# Patient Record
Sex: Female | Born: 1965 | Race: Black or African American | Hispanic: No | Smoking: Former smoker
Health system: Southern US, Community
[De-identification: ages and names within clinical notes are randomized; demographics above are authoritative.]

## PROBLEM LIST (undated history)

## (undated) DIAGNOSIS — I10 Essential (primary) hypertension: Secondary | ICD-10-CM

## (undated) DIAGNOSIS — I8390 Asymptomatic varicose veins of unspecified lower extremity: Secondary | ICD-10-CM

## (undated) HISTORY — PX: TUBAL LIGATION: SHX77

## (undated) HISTORY — PX: COSMETIC SURGERY: SHX468

## (undated) HISTORY — DX: Asymptomatic varicose veins of unspecified lower extremity: I83.90

---

## 1997-11-26 ENCOUNTER — Other Ambulatory Visit: Admission: RE | Admit: 1997-11-26 | Discharge: 1997-11-26 | Payer: Self-pay | Admitting: *Deleted

## 1999-03-17 ENCOUNTER — Encounter: Admission: RE | Admit: 1999-03-17 | Discharge: 1999-03-17 | Payer: Self-pay | Admitting: Family Medicine

## 1999-05-04 ENCOUNTER — Encounter: Admission: RE | Admit: 1999-05-04 | Discharge: 1999-05-04 | Payer: Self-pay | Admitting: Family Medicine

## 1999-08-28 ENCOUNTER — Emergency Department (HOSPITAL_COMMUNITY): Admission: EM | Admit: 1999-08-28 | Discharge: 1999-08-28 | Payer: Self-pay | Admitting: Emergency Medicine

## 1999-11-14 ENCOUNTER — Ambulatory Visit (HOSPITAL_COMMUNITY): Admission: RE | Admit: 1999-11-14 | Discharge: 1999-11-14 | Payer: Self-pay | Admitting: *Deleted

## 1999-11-14 ENCOUNTER — Encounter: Payer: Self-pay | Admitting: *Deleted

## 2000-03-22 ENCOUNTER — Encounter (INDEPENDENT_AMBULATORY_CARE_PROVIDER_SITE_OTHER): Payer: Self-pay | Admitting: Specialist

## 2000-03-22 ENCOUNTER — Inpatient Hospital Stay (HOSPITAL_COMMUNITY): Admission: AD | Admit: 2000-03-22 | Discharge: 2000-03-24 | Payer: Self-pay | Admitting: *Deleted

## 2000-12-17 ENCOUNTER — Encounter: Admission: RE | Admit: 2000-12-17 | Discharge: 2000-12-17 | Payer: Self-pay | Admitting: Family Medicine

## 2000-12-17 ENCOUNTER — Other Ambulatory Visit: Admission: RE | Admit: 2000-12-17 | Discharge: 2000-12-17 | Payer: Self-pay | Admitting: Family Medicine

## 2001-01-16 ENCOUNTER — Emergency Department (HOSPITAL_COMMUNITY): Admission: EM | Admit: 2001-01-16 | Discharge: 2001-01-17 | Payer: Self-pay | Admitting: Emergency Medicine

## 2001-01-18 ENCOUNTER — Encounter: Admission: RE | Admit: 2001-01-18 | Discharge: 2001-01-18 | Payer: Self-pay | Admitting: Family Medicine

## 2001-01-25 ENCOUNTER — Encounter: Admission: RE | Admit: 2001-01-25 | Discharge: 2001-01-25 | Payer: Self-pay | Admitting: Family Medicine

## 2001-02-04 ENCOUNTER — Encounter: Admission: RE | Admit: 2001-02-04 | Discharge: 2001-02-04 | Payer: Self-pay | Admitting: Family Medicine

## 2001-02-25 ENCOUNTER — Ambulatory Visit (HOSPITAL_COMMUNITY): Admission: RE | Admit: 2001-02-25 | Discharge: 2001-02-25 | Payer: Self-pay | Admitting: Family Medicine

## 2001-02-26 ENCOUNTER — Encounter: Admission: RE | Admit: 2001-02-26 | Discharge: 2001-02-26 | Payer: Self-pay | Admitting: Family Medicine

## 2001-03-15 ENCOUNTER — Ambulatory Visit (HOSPITAL_COMMUNITY): Admission: RE | Admit: 2001-03-15 | Discharge: 2001-03-15 | Payer: Self-pay | Admitting: *Deleted

## 2001-07-12 ENCOUNTER — Encounter: Admission: RE | Admit: 2001-07-12 | Discharge: 2001-07-12 | Payer: Self-pay | Admitting: Family Medicine

## 2001-07-15 ENCOUNTER — Encounter: Admission: RE | Admit: 2001-07-15 | Discharge: 2001-07-15 | Payer: Self-pay | Admitting: Family Medicine

## 2002-11-17 ENCOUNTER — Encounter: Payer: Self-pay | Admitting: Emergency Medicine

## 2002-11-17 ENCOUNTER — Emergency Department (HOSPITAL_COMMUNITY): Admission: EM | Admit: 2002-11-17 | Discharge: 2002-11-17 | Payer: Self-pay | Admitting: Emergency Medicine

## 2003-02-18 ENCOUNTER — Other Ambulatory Visit: Admission: RE | Admit: 2003-02-18 | Discharge: 2003-02-18 | Payer: Self-pay | Admitting: *Deleted

## 2003-12-11 ENCOUNTER — Encounter (INDEPENDENT_AMBULATORY_CARE_PROVIDER_SITE_OTHER): Payer: Self-pay | Admitting: *Deleted

## 2003-12-11 ENCOUNTER — Encounter: Admission: RE | Admit: 2003-12-11 | Discharge: 2003-12-11 | Payer: Self-pay | Admitting: Family Medicine

## 2004-01-04 ENCOUNTER — Encounter: Admission: RE | Admit: 2004-01-04 | Discharge: 2004-01-04 | Payer: Self-pay | Admitting: Sports Medicine

## 2004-01-09 ENCOUNTER — Emergency Department (HOSPITAL_COMMUNITY): Admission: EM | Admit: 2004-01-09 | Discharge: 2004-01-09 | Payer: Self-pay | Admitting: Family Medicine

## 2004-02-15 ENCOUNTER — Ambulatory Visit: Payer: Self-pay | Admitting: Sports Medicine

## 2004-05-04 ENCOUNTER — Ambulatory Visit: Payer: Self-pay | Admitting: Sports Medicine

## 2004-05-19 ENCOUNTER — Ambulatory Visit: Payer: Self-pay | Admitting: Family Medicine

## 2004-07-28 ENCOUNTER — Ambulatory Visit: Payer: Self-pay | Admitting: Sports Medicine

## 2004-11-16 ENCOUNTER — Ambulatory Visit: Payer: Self-pay | Admitting: Family Medicine

## 2005-09-11 ENCOUNTER — Encounter (INDEPENDENT_AMBULATORY_CARE_PROVIDER_SITE_OTHER): Payer: Self-pay | Admitting: *Deleted

## 2005-09-11 LAB — CONVERTED CEMR LAB

## 2005-09-29 ENCOUNTER — Ambulatory Visit: Payer: Self-pay | Admitting: Family Medicine

## 2005-09-29 ENCOUNTER — Encounter (INDEPENDENT_AMBULATORY_CARE_PROVIDER_SITE_OTHER): Payer: Self-pay | Admitting: Specialist

## 2006-02-06 ENCOUNTER — Emergency Department (HOSPITAL_COMMUNITY): Admission: EM | Admit: 2006-02-06 | Discharge: 2006-02-06 | Payer: Self-pay | Admitting: Family Medicine

## 2006-03-09 ENCOUNTER — Ambulatory Visit (HOSPITAL_COMMUNITY): Admission: RE | Admit: 2006-03-09 | Discharge: 2006-03-09 | Payer: Self-pay | Admitting: Obstetrics

## 2006-07-05 DIAGNOSIS — F17200 Nicotine dependence, unspecified, uncomplicated: Secondary | ICD-10-CM

## 2006-07-05 DIAGNOSIS — E669 Obesity, unspecified: Secondary | ICD-10-CM

## 2006-07-05 DIAGNOSIS — I10 Essential (primary) hypertension: Secondary | ICD-10-CM

## 2006-07-06 ENCOUNTER — Encounter (INDEPENDENT_AMBULATORY_CARE_PROVIDER_SITE_OTHER): Payer: Self-pay | Admitting: *Deleted

## 2006-08-02 ENCOUNTER — Emergency Department (HOSPITAL_COMMUNITY): Admission: EM | Admit: 2006-08-02 | Discharge: 2006-08-02 | Payer: Self-pay | Admitting: Family Medicine

## 2006-08-29 ENCOUNTER — Telehealth: Payer: Self-pay | Admitting: *Deleted

## 2007-01-16 ENCOUNTER — Ambulatory Visit: Payer: Self-pay | Admitting: Family Medicine

## 2007-01-16 ENCOUNTER — Encounter (INDEPENDENT_AMBULATORY_CARE_PROVIDER_SITE_OTHER): Payer: Self-pay | Admitting: Family Medicine

## 2007-01-16 LAB — CONVERTED CEMR LAB
Chlamydia, DNA Probe: NEGATIVE
GC Probe Amp, Genital: NEGATIVE

## 2007-01-24 ENCOUNTER — Encounter (INDEPENDENT_AMBULATORY_CARE_PROVIDER_SITE_OTHER): Payer: Self-pay | Admitting: Family Medicine

## 2007-01-24 ENCOUNTER — Ambulatory Visit: Payer: Self-pay | Admitting: Family Medicine

## 2007-01-24 LAB — CONVERTED CEMR LAB
BUN: 12 mg/dL (ref 6–23)
CO2: 23 meq/L (ref 19–32)
Calcium: 9 mg/dL (ref 8.4–10.5)
Chloride: 107 meq/L (ref 96–112)
Cholesterol: 145 mg/dL (ref 0–200)
Creatinine, Ser: 0.69 mg/dL (ref 0.40–1.20)
Glucose, Bld: 95 mg/dL (ref 70–99)
HCT: 41.7 % (ref 36.0–46.0)
HDL: 43 mg/dL (ref >39)
Hemoglobin: 13.9 g/dL (ref 12.0–15.0)
LDL Cholesterol: 91 mg/dL (ref 0–99)
MCHC: 33.3 g/dL (ref 30.0–36.0)
MCV: 87.8 fL (ref 78.0–100.0)
Platelets: 217 10*3/uL (ref 150–400)
Potassium: 4 meq/L (ref 3.5–5.3)
RBC: 4.75 M/uL (ref 3.87–5.11)
RDW: 13.7 % (ref 11.5–14.0)
Sodium: 139 meq/L (ref 135–145)
Total CHOL/HDL Ratio: 3.4
Triglycerides: 53 mg/dL (ref <150)
VLDL: 11 mg/dL (ref 0–40)
WBC: 5.2 10*3/uL (ref 4.0–10.5)

## 2007-02-13 ENCOUNTER — Telehealth (INDEPENDENT_AMBULATORY_CARE_PROVIDER_SITE_OTHER): Payer: Self-pay | Admitting: Family Medicine

## 2007-02-19 ENCOUNTER — Encounter: Payer: Self-pay | Admitting: *Deleted

## 2007-03-06 ENCOUNTER — Ambulatory Visit: Payer: Self-pay | Admitting: Family Medicine

## 2007-04-01 ENCOUNTER — Ambulatory Visit: Payer: Self-pay | Admitting: Family Medicine

## 2007-04-05 ENCOUNTER — Encounter: Admission: RE | Admit: 2007-04-05 | Discharge: 2007-04-05 | Payer: Self-pay | Admitting: Sports Medicine

## 2007-05-31 ENCOUNTER — Encounter: Payer: Self-pay | Admitting: *Deleted

## 2007-06-27 ENCOUNTER — Telehealth (INDEPENDENT_AMBULATORY_CARE_PROVIDER_SITE_OTHER): Payer: Self-pay | Admitting: Family Medicine

## 2008-01-15 ENCOUNTER — Encounter (INDEPENDENT_AMBULATORY_CARE_PROVIDER_SITE_OTHER): Payer: Self-pay | Admitting: *Deleted

## 2008-03-03 ENCOUNTER — Telehealth: Payer: Self-pay | Admitting: Family Medicine

## 2008-03-30 ENCOUNTER — Encounter: Payer: Self-pay | Admitting: *Deleted

## 2008-05-13 ENCOUNTER — Encounter: Payer: Self-pay | Admitting: Family Medicine

## 2008-05-13 ENCOUNTER — Ambulatory Visit: Payer: Self-pay | Admitting: Family Medicine

## 2008-05-13 LAB — CONVERTED CEMR LAB
BUN: 12 mg/dL (ref 6–23)
CO2: 26 meq/L (ref 19–32)
Calcium: 9 mg/dL (ref 8.4–10.5)
Chlamydia, DNA Probe: NEGATIVE
Chloride: 108 meq/L (ref 96–112)
Creatinine, Ser: 0.73 mg/dL (ref 0.40–1.20)
GC Probe Amp, Genital: NEGATIVE
Glucose, Bld: 97 mg/dL (ref 70–99)
Potassium: 4.2 meq/L (ref 3.5–5.3)
Sodium: 141 meq/L (ref 135–145)
Whiff Test: NEGATIVE

## 2008-05-14 ENCOUNTER — Encounter: Payer: Self-pay | Admitting: Family Medicine

## 2008-05-15 ENCOUNTER — Encounter: Payer: Self-pay | Admitting: Family Medicine

## 2008-05-28 ENCOUNTER — Encounter (INDEPENDENT_AMBULATORY_CARE_PROVIDER_SITE_OTHER): Payer: Self-pay | Admitting: Family Medicine

## 2008-07-27 ENCOUNTER — Encounter: Payer: Self-pay | Admitting: Family Medicine

## 2008-08-05 ENCOUNTER — Encounter: Payer: Self-pay | Admitting: Family Medicine

## 2008-12-07 ENCOUNTER — Ambulatory Visit: Payer: Self-pay | Admitting: Family Medicine

## 2008-12-07 ENCOUNTER — Encounter: Payer: Self-pay | Admitting: Family Medicine

## 2008-12-07 LAB — CONVERTED CEMR LAB
BUN: 14 mg/dL (ref 6–23)
CO2: 27 meq/L (ref 19–32)
Calcium: 9 mg/dL (ref 8.4–10.5)
Chlamydia, DNA Probe: NEGATIVE
Chloride: 106 meq/L (ref 96–112)
Creatinine, Ser: 0.71 mg/dL (ref 0.40–1.20)
GC Probe Amp, Genital: NEGATIVE
Glucose, Bld: 74 mg/dL (ref 70–99)
Potassium: 3.8 meq/L (ref 3.5–5.3)
Sodium: 140 meq/L (ref 135–145)
Whiff Test: POSITIVE

## 2008-12-11 ENCOUNTER — Encounter: Payer: Self-pay | Admitting: Family Medicine

## 2009-02-04 ENCOUNTER — Ambulatory Visit: Payer: Self-pay | Admitting: Family Medicine

## 2009-02-04 ENCOUNTER — Encounter: Payer: Self-pay | Admitting: Family Medicine

## 2009-02-04 LAB — CONVERTED CEMR LAB
Chlamydia, DNA Probe: NEGATIVE
GC Probe Amp, Genital: NEGATIVE
Whiff Test: POSITIVE

## 2009-02-08 ENCOUNTER — Encounter: Payer: Self-pay | Admitting: Family Medicine

## 2009-08-13 ENCOUNTER — Ambulatory Visit: Payer: Self-pay | Admitting: Family Medicine

## 2009-08-13 ENCOUNTER — Encounter: Payer: Self-pay | Admitting: Family Medicine

## 2009-08-13 ENCOUNTER — Encounter: Admission: RE | Admit: 2009-08-13 | Discharge: 2009-08-13 | Payer: Self-pay | Admitting: Family Medicine

## 2009-08-13 DIAGNOSIS — I8390 Asymptomatic varicose veins of unspecified lower extremity: Secondary | ICD-10-CM

## 2009-08-13 HISTORY — DX: Asymptomatic varicose veins of unspecified lower extremity: I83.90

## 2009-08-13 LAB — CONVERTED CEMR LAB
BUN: 12 mg/dL (ref 6–23)
CO2: 26 meq/L (ref 19–32)
Calcium: 8.6 mg/dL (ref 8.4–10.5)
Chlamydia, DNA Probe: NEGATIVE
Chloride: 105 meq/L (ref 96–112)
Cholesterol: 157 mg/dL (ref 0–200)
Creatinine, Ser: 0.64 mg/dL (ref 0.40–1.20)
GC Probe Amp, Genital: NEGATIVE
Glucose, Bld: 94 mg/dL (ref 70–99)
HCT: 37.2 % (ref 36.0–46.0)
HCV Ab: NEGATIVE
HDL: 47 mg/dL (ref >39)
Hemoglobin: 12.2 g/dL (ref 12.0–15.0)
Hep B S Ab: NEGATIVE
Hepatitis B Surface Ag: NEGATIVE
LDL Cholesterol: 99 mg/dL (ref 0–99)
MCHC: 32.8 g/dL (ref 30.0–36.0)
MCV: 89.6 fL (ref 78.0–100.0)
Pap Smear: NEGATIVE
Platelets: 236 10*3/uL (ref 150–400)
Potassium: 3.9 meq/L (ref 3.5–5.3)
RBC: 4.15 M/uL (ref 3.87–5.11)
RDW: 13.8 % (ref 11.5–15.5)
Sodium: 138 meq/L (ref 135–145)
Total CHOL/HDL Ratio: 3.3
Triglycerides: 55 mg/dL (ref <150)
VLDL: 11 mg/dL (ref 0–40)
WBC: 5.1 10*3/uL (ref 4.0–10.5)

## 2009-08-24 ENCOUNTER — Encounter: Payer: Self-pay | Admitting: Family Medicine

## 2009-11-23 ENCOUNTER — Ambulatory Visit: Payer: Self-pay | Admitting: Psychology

## 2009-12-03 ENCOUNTER — Telehealth: Payer: Self-pay | Admitting: Family Medicine

## 2010-02-14 ENCOUNTER — Telehealth: Payer: Self-pay | Admitting: *Deleted

## 2010-05-12 ENCOUNTER — Emergency Department (HOSPITAL_COMMUNITY)
Admission: EM | Admit: 2010-05-12 | Discharge: 2010-05-12 | Payer: Self-pay | Source: Home / Self Care | Admitting: Emergency Medicine

## 2010-05-12 LAB — POCT I-STAT, CHEM 8
BUN: 11 mg/dL (ref 6–23)
Calcium, Ion: 1.1 mmol/L — ABNORMAL LOW (ref 1.12–1.32)
Chloride: 104 mEq/L (ref 96–112)
Creatinine, Ser: 0.7 mg/dL (ref 0.4–1.2)
Glucose, Bld: 100 mg/dL — ABNORMAL HIGH (ref 70–99)
HCT: 37 % (ref 36.0–46.0)
Hemoglobin: 12.6 g/dL (ref 12.0–15.0)
Potassium: 3.4 mEq/L — ABNORMAL LOW (ref 3.5–5.1)
Sodium: 139 mEq/L (ref 135–145)
TCO2: 25 mmol/L (ref 0–100)

## 2010-05-12 LAB — POCT CARDIAC MARKERS
CKMB, poc: <1 ng/mL — ABNORMAL LOW (ref 1.0–8.0)
Myoglobin, poc: 35.7 ng/mL (ref 12–200)
Troponin i, poc: <0.05 ng/mL (ref 0.00–0.09)

## 2010-05-29 ENCOUNTER — Encounter: Payer: Self-pay | Admitting: Family Medicine

## 2010-05-30 ENCOUNTER — Encounter: Payer: Self-pay | Admitting: Family Medicine

## 2010-05-30 ENCOUNTER — Ambulatory Visit: Admit: 2010-05-30 | Payer: Self-pay

## 2010-06-09 NOTE — Assessment & Plan Note (Addendum)
Summary: Smoking cessation class   Primary Care Provider:  Ardeen Garland  MD   History of Present Illness: Patient attended a smoking cessation class.  Currently, she reported smoking about 1 pack of cigarettes per day.    Allergies: No Known Drug Allergies   Impression & Recommendations:  Problem # 1:  TOBACCO DEPENDENCE (ICD-305.1)  Patient appeared to be in the contemplation stage of change regarding smoking cessation.  On a scale of 1-10, she reported her desire to quit as a 7-8.  She did not report having quit in the past and expressed concern that she did not have another means of relieving stress.  However, patient was able to identify several reasons to quit smoking (e.g., health problems for herself and children, cost).  I provided patient with several cognitive and behavioral techniques for addressing her psychological addition to cigarettes.  She seemed particularly interested in the idea of exercising as an alternative to relieve stress.  She also expressed interest in attending the next smoking cessation class (with the pharmacist) next week to learn about pharmacological smoking cessation aids.  Patient's children serve as supports to her quitting smoking.  Her place of employment is also about to implement a no-smoking on grounds policy, motivating her to quit fairly soon.  She did not set a quit date during the class.  She said that she would feel more comfortable doing so after the next smoking cessation class.   Armida Sans  Behavioral Medicine Student  November 24, 2009 9:53 AM  Orders: Smoking Cessation Class [phone])  Complete Medication List: 1)  Lisinopril-hydrochlorothiazide 20-12.5 Mg Tabs (Lisinopril-hydrochlorothiazide) .Marland Kitchen.. 1 tablet by mouth daily

## 2010-06-09 NOTE — Progress Notes (Addendum)
Summary: Rx Reqq  Phone Note Refill Request Call back at [phone] Message from:  Patient  Refills Requested: Medication #1:  LISINOPRIL-HYDROCHLOROTHIAZIDE 20-12.5 MG TABS 1 tablet by mouth daily. [facility]  Initial call taken by: Clydell Hakim,  December 03, 2009 3:48 PM    Prescriptions: LISINOPRIL-HYDROCHLOROTHIAZIDE 20-12.5 MG TABS (LISINOPRIL-HYDROCHLOROTHIAZIDE) 1 tablet by mouth daily  #30 x 6   Entered and Authorized by:   Ellin Mayhew MD   Signed by:   Ellin Mayhew MD on 12/03/2009   Method used:   Electronically to        [facility] (retail)       [address]       [place], [place]  [address]       Ph: [phone]       Fax: [phone]   RxID:   [id number]

## 2010-06-09 NOTE — Assessment & Plan Note (Addendum)
Summary: cpe,df   Vital Signs:  Patient profile:   45 year old female Height:      64.75 inches Weight:      198 pounds BMI:     33.32 Temp:     97.8 degrees F oral Pulse rate:   75 / minute BP sitting:   118 / 79  (left arm) Cuff size:   regular  Vitals Entered By: Tessie Fass CMA (August 13, 2009 9:09 AM) CC: complete phuysical with pap Is Patient Diabetic? No Pain Assessment Patient in pain? no        Primary Care Provider:  Ardeen Garland  MD  CC:  complete phuysical with pap.  History of Present Illness: Ellen Ware comes in for CPE with pap smear.  Wishes to discuss smoking cessation and her varicose veins 1) CPE with pap- no h/o abnormal paps.  Is sexually active and does not always use protection.  Would like full STD check today.  history of trichomonas.  Not currently having vaginal discharge or itching.  Had mammogram this morning before coming to the appointment.   Has Hypertension.  Due for labwork.  Tolerating her HCTZ - Lisinopril well. 2) SMoking cessation - smoking 1 ppd nd ready to quit.  Her son haas asthma and she is noticing her smoking is making it worse.  Has tried wellbutrin in the past and it helped her.  Was re-prescribed at her physical last year but she never picked it up.  3) Varicose veins - has enlarged, tortuous veins on the back of her thighs and they hurt her sometimes.  REally would like to do something about them.  Even wears support hose during the day at work but not helping much.   Habits & Providers  Alcohol-Tobacco-Diet     Tobacco Status: current     Tobacco Counseling: to quit use of tobacco products     Cigarette Packs/Day: 1.0  Current Medications (verified): 1)  Lisinopril-Hydrochlorothiazide 20-12.5 Mg Tabs (Lisinopril-Hydrochlorothiazide) .Marland Kitchen.. 1 Tablet By Mouth Daily  Allergies: No Known Drug Allergies  Past History:  Past Medical History: Last updated: 07/05/2006 G2P2  Past Surgical History: Last updated:  07/05/2006 2 C/S most recent 03/2000 - 12/17/2000, BTL - 03/08/2000  Family History: Last updated: 01/16/2007 1 brother - healthy, 2 sisters - one with Diabetes, Father died in the 46`s in a suspected MVA (he was a black cop during racial tensions in the [place]), Mother alive born 1, DM, COPD, CHF, ARF, HTN, inc lipids, Mother w/ CABG at 54 and CVA Family History of Anemia/FE deficiency  Social History: Last updated: 05/13/2008 Smokes 1 ppd for since 1997, Uses Marijuana daily.  Denies any other drug use - uses ETOH about 3 times/week.  Currently single.  has two boys (ages 61 and 4).  Works as a Investment banker, corporate.  Physical Exam  General:  alert, well-developed, well-nourished, and well-hydrated.  NAD.  vitals reviewed. Eyes:  pupils equal, pupils round, pupils reactive to light, corneas and lenses clear, and no injection.   Ears:  External ear exam shows no significant lesions or deformities.  Otoscopic examination reveals clear canals, tympanic membranes are intact bilaterally without bulging, retraction, inflammation or discharge. Hearing is grossly normal bilaterally. Nose:  External nasal examination shows no deformity or inflammation. Nasal mucosa are pink and moist without lesions or exudates. Mouth:  Oral mucosa and oropharynx without lesions or exudates.  Teeth in good repair. Neck:  No deformities, masses, or tenderness noted. Breasts:  No  mass, nodules, thickening, tenderness, bulging, retraction, inflamation, nipple discharge or skin changes noted.   Lungs:  Normal respiratory effort, chest expands symmetrically. Lungs are clear to auscultation, no crackles or wheezes. Heart:  Normal rate and regular rhythm. S1 and S2 normal without gallop, murmur, click, rub or other extra sounds. Abdomen:  Bowel sounds positive,abdomen soft and non-tender without masses, organomegaly or hernias noted. Genitalia:  Normal introitus for age, no external lesions, no vaginal discharge, mucosa pink and  moist, no vaginal or cervical lesions, no vaginal atrophy, no friaility or hemorrhage, normal uterus size and position, no adnexal masses or tenderness Pulses:  2+ radial and dp p ulses Extremities:  no edema tortuous, enlarged superficial veins on bilateral posterior thighs Skin:  Intact without suspicious lesions or rashes Cervical Nodes:  No lymphadenopathy noted Axillary Nodes:  No palpable lymphadenopathy   Impression & Recommendations:  Problem # 1:  ROUTINE GYNECOLOGICAL EXAMINATION (ICD-V72.31) Assessment New  Pap and STD tests obtained.  Mammo alaready done.  RTC in 1 year.  Will check lipids and BMET and CBC for screenign lipids and for HTN today.   Orders: [facility] - Est  40-64 yrs ([address]78)  Problem # 2:  TOBACCO DEPENDENCE (ICD-305.1) Assessment: Unchanged Referred to our smoking cessation class.  Given handout about the class.  Congratulated on her desire to quit! The following medications were removed from the medication list:    Zyban 150 Mg Xr12h-tab (Bupropion hcl (smoking deter)) .Marland Kitchen... 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   Orders: Vascular Clinic (Vascular)  Complete Medication List: 1)  Lisinopril-hydrochlorothiazide 20-12.5 Mg Tabs (Lisinopril-hydrochlorothiazide) .Marland Kitchen.. 1 tablet by mouth daily  Other Orders: Basic Met-[facility] [phone]) Lipid-[facility] ([id number]) CBC-[facility] (28413) Hep Bs Ab-[facility] (24401-02725) Hep Bs Ag-[facility] (36644-03474) Hep C Ab-[facility] (25956-38756) HIV-[facility] (43329-51884) RPR-[facility] [phone]) GC/Chlamydia-[facility] (87591/87491) Pap Smear-[facility] (10932-35573) [facility] ([address]25)  Patient Instructions: 1)  Thank you for coming in today. 2)  It is wonderful you are ready to quit smoking!  Please call to schedule your visit with our smoking  cessation class. 3)  Thanks for getting your mammogram today! 4)  We will mail the results of your bloodwork and pap when we get them all back.  If something is wrong, we will call you.  5)  Please return in 6 months for a blood pressure check-up.   Laboratory Results  Date/Time Received: August 13, 2009 9:39 AM  Date/Time Reported: August 13, 2009 9:56 AM   [employer] Source: vaginal WBC/hpf: 0-3 Bacteria/hpf: 3+ Clue cells/hpf: none Yeast/hpf: none Trichomonas/hpf: none Comments: ...........test performed by...........Marland KitchenTerese Door, CMA

## 2010-06-09 NOTE — Progress Notes (Addendum)
----   Converted from flag ---- ---- 02/12/2010 11:34 AM, Ellin Mayhew MD wrote: can you please get records from the vein and vascular surgery clinic for this pt's visit on 09/29/09?  Thanks, Dawn ------------------------------  called and requested records.

## 2010-06-09 NOTE — Letter (Addendum)
Summary: Generic Letter  [facility]  [address]   [place], [place] [address]   Phone: [phone]  Fax: [phone]    08/24/2009  Ellen Ware [address] [place], [place]  [address]  Dear Ms. MURALLES,  I am happy to inform you that all of your recent tests were normal.  Your pap smear was normal.  All of the STD tests were negative.  Your bloodwork was excellent!  If you have any questions, please call our office.          Sincerely,   Ardeen Garland  MD  Appended Document: Generic Letter mailed.

## 2010-06-14 ENCOUNTER — Encounter: Payer: Self-pay | Admitting: *Deleted

## 2010-06-29 ENCOUNTER — Encounter: Payer: Self-pay | Admitting: Family Medicine

## 2010-07-04 ENCOUNTER — Ambulatory Visit (INDEPENDENT_AMBULATORY_CARE_PROVIDER_SITE_OTHER): Payer: PRIVATE HEALTH INSURANCE | Admitting: Family Medicine

## 2010-07-04 ENCOUNTER — Encounter: Payer: Self-pay | Admitting: Family Medicine

## 2010-07-04 VITALS — BP 123/82 | HR 99 | Temp 98.4°F | Wt 197.0 lb

## 2010-07-04 DIAGNOSIS — N760 Acute vaginitis: Secondary | ICD-10-CM

## 2010-07-04 DIAGNOSIS — B9689 Other specified bacterial agents as the cause of diseases classified elsewhere: Secondary | ICD-10-CM

## 2010-07-04 DIAGNOSIS — R059 Cough, unspecified: Secondary | ICD-10-CM

## 2010-07-04 DIAGNOSIS — R300 Dysuria: Secondary | ICD-10-CM

## 2010-07-04 DIAGNOSIS — R05 Cough: Secondary | ICD-10-CM

## 2010-07-04 DIAGNOSIS — A499 Bacterial infection, unspecified: Secondary | ICD-10-CM

## 2010-07-04 LAB — POCT URINALYSIS DIPSTICK
Bilirubin, UA: NEGATIVE
Glucose, UA: NEGATIVE
Ketones, UA: NEGATIVE
Nitrite, UA: NEGATIVE
Protein, UA: 100
Spec Grav, UA: 1.02
Urobilinogen, UA: 0.2
pH, UA: 6.5

## 2010-07-04 LAB — POCT UA - MICROSCOPIC ONLY

## 2010-07-04 MED ORDER — SULFAMETHOXAZOLE-TRIMETHOPRIM 800-160 MG PO TABS: 1.0000 | ORAL_TABLET | Freq: Two times a day (BID) | ORAL | Status: AC

## 2010-07-04 MED ORDER — BENZONATATE 100 MG PO CAPS: 100.0000 mg | ORAL_CAPSULE | Freq: Four times a day (QID) | ORAL | Status: DC | PRN

## 2010-07-04 NOTE — Patient Instructions (Signed)
It was nice to meet you today!  I have sent your prescriptions to [facility].

## 2010-07-05 ENCOUNTER — Encounter: Payer: Self-pay | Admitting: Family Medicine

## 2010-07-05 ENCOUNTER — Encounter (INDEPENDENT_AMBULATORY_CARE_PROVIDER_SITE_OTHER): Payer: Self-pay | Admitting: *Deleted

## 2010-07-05 DIAGNOSIS — B9689 Other specified bacterial agents as the cause of diseases classified elsewhere: Secondary | ICD-10-CM | POA: Insufficient documentation

## 2010-07-05 MED ORDER — METRONIDAZOLE 500 MG PO TABS: 500.0000 mg | ORAL_TABLET | Freq: Two times a day (BID) | ORAL | Status: AC

## 2010-07-05 NOTE — Assessment & Plan Note (Signed)
Will Tx with Bactrim (patient with PCN allergy). Will obtain UCx.

## 2010-07-05 NOTE — Assessment & Plan Note (Signed)
Clue cells on UA. Will treat.

## 2010-07-05 NOTE — Assessment & Plan Note (Signed)
URI. No red flags. Rx Tessalon for cough.

## 2010-07-05 NOTE — Progress Notes (Signed)
  Subjective:    Patient ID: Ellen Ware, female    DOB: 01/17/1966, 45 y.o.   MRN: [id number]  HPI  1. Dysuria: x several days, associated with abdominal pain, back pain, and frequency. No fever/chills, N/V/D, rash, vaginal DC.  2. URI: x several days, with non-productive cough, sneeze, intermittent generalized HA. No dizziness, ear pain, sore throat, CP, SOB, LE edema. No sick contacts. Coughing keeping patient up at night. She is a smoker.   Review of Systems See HPI.    Objective:   Physical Exam  Constitutional: Vital signs are normal. She appears well-developed and well-nourished. No distress.  HENT:  Right Ear: Tympanic membrane normal.  Left Ear: Tympanic membrane normal.  Nose: Mucosal edema and rhinorrhea present.  Mouth/Throat: Oropharynx is clear and moist and mucous membranes are normal. No posterior oropharyngeal erythema.  Eyes: Conjunctivae are normal.  Cardiovascular: Normal rate, regular rhythm, normal heart sounds and normal pulses.   Pulmonary/Chest: Effort normal and breath sounds normal.  Abdominal: Soft. Normal appearance and bowel sounds are normal. There is tenderness in the suprapubic area. There is no CVA tenderness.  Lymphadenopathy:    She has no cervical adenopathy.  Neurological: She is alert.          Assessment & Plan:

## 2010-07-07 LAB — URINE CULTURE: Colony Count: >=100000

## 2010-07-22 ENCOUNTER — Telehealth: Payer: Self-pay | Admitting: Family Medicine

## 2010-07-22 DIAGNOSIS — I10 Essential (primary) hypertension: Secondary | ICD-10-CM

## 2010-07-22 MED ORDER — LISINOPRIL-HYDROCHLOROTHIAZIDE 20-12.5 MG PO TABS: 1.0000 | ORAL_TABLET | Freq: Every day | ORAL | Status: DC

## 2010-07-22 NOTE — Telephone Encounter (Signed)
Called pharmacy and was told that we had denied RX on 07/19/2010. There is nothing about this in chart. Paged Dr. Edmonia James to ask if she knew why Rx had been cancelled . She advises RX may be refilled. Patient has scheduled appointment with MD soon.

## 2010-07-22 NOTE — Telephone Encounter (Signed)
Pt calling about bp med refill, was told we denied it but pt just seen last month, is out.

## 2010-08-05 ENCOUNTER — Other Ambulatory Visit: Payer: Self-pay | Admitting: Family Medicine

## 2010-08-05 DIAGNOSIS — Z1231 Encounter for screening mammogram for malignant neoplasm of breast: Secondary | ICD-10-CM

## 2010-08-09 ENCOUNTER — Other Ambulatory Visit (HOSPITAL_COMMUNITY)
Admission: RE | Admit: 2010-08-09 | Discharge: 2010-08-09 | Disposition: A | Discharge status: Home / Self Care | Payer: PRIVATE HEALTH INSURANCE | Source: Ambulatory Visit | Attending: Family Medicine | Admitting: Family Medicine

## 2010-08-09 ENCOUNTER — Encounter: Payer: Self-pay | Admitting: Family Medicine

## 2010-08-09 ENCOUNTER — Ambulatory Visit (INDEPENDENT_AMBULATORY_CARE_PROVIDER_SITE_OTHER): Payer: PRIVATE HEALTH INSURANCE | Admitting: Family Medicine

## 2010-08-09 VITALS — BP 126/79 | HR 85 | Temp 98.5°F | Ht 64.75 in | Wt 199.0 lb

## 2010-08-09 DIAGNOSIS — Z202 Contact with and (suspected) exposure to infections with a predominantly sexual mode of transmission: Secondary | ICD-10-CM

## 2010-08-09 DIAGNOSIS — D179 Benign lipomatous neoplasm, unspecified: Secondary | ICD-10-CM

## 2010-08-09 DIAGNOSIS — Z01419 Encounter for gynecological examination (general) (routine) without abnormal findings: Secondary | ICD-10-CM | POA: Insufficient documentation

## 2010-08-09 DIAGNOSIS — Z124 Encounter for screening for malignant neoplasm of cervix: Secondary | ICD-10-CM

## 2010-08-09 DIAGNOSIS — Z7189 Other specified counseling: Secondary | ICD-10-CM

## 2010-08-09 DIAGNOSIS — N907 Vulvar cyst: Secondary | ICD-10-CM

## 2010-08-09 DIAGNOSIS — N9089 Other specified noninflammatory disorders of vulva and perineum: Secondary | ICD-10-CM

## 2010-08-09 DIAGNOSIS — I10 Essential (primary) hypertension: Secondary | ICD-10-CM

## 2010-08-09 DIAGNOSIS — F17200 Nicotine dependence, unspecified, uncomplicated: Secondary | ICD-10-CM

## 2010-08-09 DIAGNOSIS — N760 Acute vaginitis: Secondary | ICD-10-CM

## 2010-08-09 LAB — POCT WET PREP (WET MOUNT)
Trichomonas Wet Prep HPF POC: NEGATIVE
Yeast Wet Prep HPF POC: NEGATIVE

## 2010-08-09 LAB — BASIC METABOLIC PANEL
BUN: 13 mg/dL (ref 6–23)
CO2: 23 mEq/L (ref 19–32)
Calcium: 8.7 mg/dL (ref 8.4–10.5)
Chloride: 104 mEq/L (ref 96–112)
Creat: 0.8 mg/dL (ref 0.40–1.20)
Glucose, Bld: 98 mg/dL (ref 70–99)
Potassium: 4 mEq/L (ref 3.5–5.3)
Sodium: 138 mEq/L (ref 135–145)

## 2010-08-09 LAB — LIPID PANEL
Cholesterol: 148 mg/dL (ref 0–200)
HDL: 44 mg/dL (ref >39)
LDL Cholesterol: 89 mg/dL (ref 0–99)
Total CHOL/HDL Ratio: 3.4 Ratio
Triglycerides: 74 mg/dL (ref <150)
VLDL: 15 mg/dL (ref 0–40)

## 2010-08-09 LAB — RPR

## 2010-08-09 NOTE — Patient Instructions (Addendum)
I will send you a letter or give you a call regarding the results of your bloodwork. Make an appointment with Dr. Raymondo Band for smoking cessation class. 1-800- quit-now is a good resource. Continue taking blood pressure medication.   Remember always use protection when having sex to protect against std's. Make a return appointment for in 1 year for your next physical exam. If you would like the lipoma removed- you will need to make a 45 min appointment for this procedure. Return in 8 weeks for recheck of HIV test.

## 2010-08-10 LAB — GC/CHLAMYDIA PROBE AMP, GENITAL
Chlamydia, DNA Probe: NEGATIVE
GC Probe Amp, Genital: NEGATIVE

## 2010-08-10 LAB — HIV ANTIBODY (ROUTINE TESTING W REFLEX): HIV: REACTIVE

## 2010-08-11 DIAGNOSIS — Z7189 Other specified counseling: Secondary | ICD-10-CM | POA: Insufficient documentation

## 2010-08-11 DIAGNOSIS — N907 Vulvar cyst: Secondary | ICD-10-CM | POA: Insufficient documentation

## 2010-08-11 MED ORDER — LISINOPRIL-HYDROCHLOROTHIAZIDE 20-12.5 MG PO TABS: 1.0000 | ORAL_TABLET | Freq: Every day | ORAL | Status: DC

## 2010-08-11 NOTE — Assessment & Plan Note (Signed)
bp wnl.  Will recheck bmet today.

## 2010-08-11 NOTE — Assessment & Plan Note (Signed)
Encouraged smoking cessation.  Pt encouraged to see Dr. Raymondo Band to discuss pharm options.

## 2010-08-11 NOTE — Assessment & Plan Note (Signed)
Benign appearing labial cyst on left labia.  Reassured pt.  Pt to monitor and return if increase size, if redness, or pain.

## 2010-08-11 NOTE — Assessment & Plan Note (Signed)
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  Pt requesting removal.  Discussed with preceptor.  Will agree to take lipoma off, but pt needs to return for a 45 min procedure visit to do this.   Reassured pt that this is benign and that removal is not necessary.

## 2010-08-11 NOTE — Assessment & Plan Note (Signed)
GC/Chlam prob pending.  HIV and RPR labs drawn.  Pt to return in 8 weeks for repeat HIV screening.

## 2010-08-11 NOTE — Assessment & Plan Note (Signed)
Pap smear obtained. Std testing pending.  Reviewed importance of safe sex.

## 2010-08-11 NOTE — Progress Notes (Signed)
  Subjective:    Patient ID: Ellen Ware, female    DOB: 04-30-1966, 45 y.o.   MRN: [id number]  HPI Pt her for annual physical:  Concern for "bump" on left labia: No redness. No pain, no increase in size.  No h/o labial abscess.    Pap smear today: No h/o abnormal pap per pt.  Smoking cessation: Went to first class but didn't return for 2nd smoking cessation class.  Pt states that she is still interested in trying to quit smoking.    Unprotected sex: Pt is sexually active with current partner.  She states that they are exclusive.  The do not use condoms.  Pt would like to be checked for STD's.  + foul odor discharge, white in color.   Left elbow nodule: "knot" present x years.  Pt requesting that it be removed.  Doesn't hurt. No redness. No drainage.  Pt doesn't like the way that it looks cosmetically.     Review of Systems    as per above- no cough, no weight loss, no fever. No problems with bowel or bladder. Objective:   Physical Exam  Constitutional: She appears well-developed and well-nourished.  HENT:  Head: Normocephalic and atraumatic.  Cardiovascular: Normal rate, regular rhythm and normal heart sounds.   No murmur heard. Pulmonary/Chest: Effort normal and breath sounds normal. No respiratory distress. She has no wheezes.  Abdominal: Soft. Bowel sounds are normal. She exhibits no distension. There is no tenderness.  Genitourinary: Uterus normal.    No breast swelling, tenderness, discharge or bleeding. Cervix exhibits no motion tenderness, no discharge and no friability. Vaginal discharge found.    Skin: Skin is warm and dry.  Psychiatric: She has a normal mood and affect. Her behavior is normal. Judgment and thought content normal.          Assessment & Plan:

## 2010-08-12 LAB — HIV 1/2 CONFIRMATION
HIV-1 antibody: NEGATIVE
HIV-2 Ab: NEGATIVE

## 2010-08-19 ENCOUNTER — Ambulatory Visit: Payer: PRIVATE HEALTH INSURANCE

## 2010-08-22 ENCOUNTER — Telehealth: Payer: Self-pay | Admitting: Family Medicine

## 2010-08-22 ENCOUNTER — Other Ambulatory Visit: Payer: Self-pay | Admitting: Family Medicine

## 2010-08-22 NOTE — Telephone Encounter (Signed)
Refill request

## 2010-08-22 NOTE — Telephone Encounter (Signed)
Pt states her  Lisinopril is now out and needs refilled today - [facility] that [facility]

## 2010-08-22 NOTE — Telephone Encounter (Signed)
Refill rrequest

## 2010-08-25 NOTE — Telephone Encounter (Signed)
Pt calling again, has been out since Sunday. Pt saw Dr. Edmonia James on 4/3. Wants to know if RN can call pharmacy.

## 2010-08-25 NOTE — Telephone Encounter (Signed)
Forward to caviness

## 2010-08-26 ENCOUNTER — Ambulatory Visit: Payer: PRIVATE HEALTH INSURANCE | Admitting: Pharmacist

## 2010-08-26 ENCOUNTER — Ambulatory Visit
Admission: RE | Admit: 2010-08-26 | Discharge: 2010-08-26 | Disposition: A | Discharge status: Home / Self Care | Payer: PRIVATE HEALTH INSURANCE | Source: Ambulatory Visit | Attending: Family Medicine | Admitting: Family Medicine

## 2010-08-26 DIAGNOSIS — Z1231 Encounter for screening mammogram for malignant neoplasm of breast: Secondary | ICD-10-CM

## 2010-08-27 ENCOUNTER — Other Ambulatory Visit: Payer: Self-pay | Admitting: Family Medicine

## 2010-08-27 ENCOUNTER — Encounter: Payer: Self-pay | Admitting: Family Medicine

## 2010-08-27 DIAGNOSIS — B9689 Other specified bacterial agents as the cause of diseases classified elsewhere: Secondary | ICD-10-CM

## 2010-08-27 MED ORDER — METRONIDAZOLE 500 MG PO TABS: 500.0000 mg | ORAL_TABLET | Freq: Two times a day (BID) | ORAL | Status: AC

## 2010-08-27 NOTE — Telephone Encounter (Signed)
Called pt and discussed with pt rx requests.  rx sent again to pharmacy.  Also reviewed lab results with patient.  Also sent in flagyl rx to pharmacy for bacterial vaginoisis- + wet prep. Pt endorses some vaginal discharge so i will treat this since it is symptomatic.

## 2010-08-29 ENCOUNTER — Encounter: Payer: Self-pay | Admitting: Family Medicine

## 2010-09-23 NOTE — Op Note (Signed)
[facility]  Patient:    Ellen Ware, Ellen Ware                     MRN: [id number] Proc. Date: 03/22/00 Adm. Date:  [id number] Attending:  Deniece Ree                           Operative Report  PREOPERATIVE DIAGNOSES:       1. Intrauterine pregnancy at term, previous                                  cesarean section, multiparity.                               2. Desires permanent sterilization.  OPERATION:                    1. Repeat cesarean section.                               2. Bilateral tubal ligation using the                                  modified Pomeroys procedure.  POSTOPERATIVE DIAGNOSES:      1. Intrauterine pregnancy at term, previous                                  cesarean section, multiparity.                               2. Desires permanent sterilization.                               3. A viable female infant with an Apgar of                                  8 and 9.  SURGEON:                      Deniece Ree, M.D.  ASSISTANT:                    Kathreen Cosier, M.D.  ANESTHESIA:                   Epidural, Dr. ______, pediatric teaching                               service.  ESTIMATED BLOOD LOSS:         300 cc.  CATHETER:                     A Foley was left to straight drainage.  The patient tolerated the procedure well and returned to the recovery room in satisfactory condition.  DESCRIPTION OF PROCEDURE:     The patient was taken to the operating room and prepped and draped  in the usual fashion for a repeat cesarean section.  A low Pfannenstiel incision was made following the course of the previous incision. This was carried down to the fascia, at which time the fascia was entered and excised in the extent of the incision.  The midline was identified and the rectus muscles were separated.  The abdominal peritoneum was then entered in a vertical fashion using Metzenbaum scissors.  The official  peritoneum was then excised bilaterally towards the round ligaments, following which the lower uterine segment was scored, entered in the midline, and bluntly dissected open.  The right hand introduced, and a viable infant head was delivered.  The nasal and oropharynx were then suctioned out with a suction bulb.  Complete delivery was then carried out without any problems.  It was noted that the cord was wrapped around the infants body and legs, which was removed.  The cord was clamped and separated without any complications.  The infant was then turned over to the pediatricians, who were in attendance.  The cord blood was then obtained, following which the placenta as well as all products of conception were then manually removed from the uterine cavity.  IV Pitocin as well as IV antibiotics were then begun.  The myometrium was then closed with #1 chromic in a running locking stitch, followed by an imbricating stitch again using the #1 chromic.  Reperitonealization was carried out using a #2-0 chromic in a running stitch.  The left tube was then identified, grasped with the Babcock clamp, followed out until the fimbriated end could be identified.  It was then knuckled up, and utilizing #0 plain catgut, ligated in a routine modified Pomeroys procedure.  This was done likewise on the right side.  Both tubal segments were later sent to pathology.  Hemostasis was present.  The sponge and needle counts were correct x 2.  The ovaries bilaterally appeared to be within normal limits.  The abdominal peritoneum was then closed using #2-0 chromic in a running stitch, followed by closure of the fascia using #1 Vicryl in a running stitch.  The skin was closed with skin staples.  The procedure was terminated.  The patient tolerated the procedure well and returned to the recovery room in satisfactory condition.DD:  03/22/00 TD:  03/22/00 Job: [id number]

## 2010-09-23 NOTE — H&P (Signed)
[facility]  Patient:    Ellen Ware, Ellen Ware                     MRN: [id number] Adm. Date:  [id number] Attending:  Deniece Ree                         History and Physical  HISTORY:                      The patient is a 45 year old gravida 2, para 1 whose estimated date of confinement was March 27, 2000.  The patient had a previous cesarean section in 1990 and is very adamant about having a repeat cesarean section.  The patient has not had any prenatal complications.  She is also desirous of permanent sterilization.  She understands the different type of contraceptives available and does not wish to have any other pregnancies. She understands this procedure has a tendency to be permanent but, however, cannot be guaranteed.  She also accepts the risks.  PAST MEDICAL HISTORY, FAMILY HISTORY AND REVIEW OF SYSTEMS:    Noncontributory with the exception of a cesarean section in 1990 and also a hernia repair.  PHYSICAL EXAMINATION:  GENERAL:                      Well-developed, well-nourished gravid female in no acute distress.  HEENT:                        Within normal limits.  NECK:                         Supple.  BREASTS:                      Without masses, tenderness or discharge.  LUNGS:                        Clear to auscultation and percussion.  HEART:                        Normal sinus rhythm without murmurs, rubs, or gallops.  ABDOMEN:                      Term with fetal heartbeats in the left lower quadrant.  EXTREMITIES:                  Within normal limits.  NEUROLOGIC:                   Within normal limits.  PELVIC:                       Not done.  DIAGNOSES:                    1. Intrauterine pregnancy at term; previous                                  cesarean section.                               2. Multiparity, desirous of permanent  sterilization.  PLAN:                          Repeat cesarean section and bilateral tubal ligation. DD:  03/22/00 TD:  03/22/00 Job: [id number]

## 2010-09-23 NOTE — Discharge Summary (Signed)
[facility]  Patient:    Ellen Ware, Ellen Ware                     MRN: [id number] Adm. Date:  [id number] Disc. Date: [id number] Attending:  Deniece Ree                           Discharge Summary  HISTORY/HOSPITAL COURSE:                      The patient is a 45 year old gravida 2, para 1, whose estimated date of confinement was March 27, 2000. The patient had a previous cesarean section in 1990 and was very adamant about having a repeat cesarean section. The patient was also desiring permanent sterilization. On the day of admission, patient underwent a repeat cesarean section and a bilateral tubal ligation.  Postoperatively this patient did very well without any problems and was discharged on the third postoperative day. She was instructed on the possible care and complications following this type of surgery.  DISCHARGE INSTRUCTIONS:  She was told to return to my office in four weeks for followup evaluation or to call me prior to that time should any problems arise. DD:  04/18/00 TD:  04/18/00 Job: [id number]

## 2010-10-26 ENCOUNTER — Ambulatory Visit: Payer: PRIVATE HEALTH INSURANCE | Admitting: Family Medicine

## 2011-04-20 ENCOUNTER — Ambulatory Visit (INDEPENDENT_AMBULATORY_CARE_PROVIDER_SITE_OTHER): Payer: PRIVATE HEALTH INSURANCE | Admitting: Pharmacist

## 2011-04-20 ENCOUNTER — Encounter: Payer: Self-pay | Admitting: *Deleted

## 2011-04-20 ENCOUNTER — Encounter: Payer: Self-pay | Admitting: Pharmacist

## 2011-04-20 VITALS — BP 117/79 | HR 90 | Ht 64.5 in | Wt 198.2 lb

## 2011-04-20 DIAGNOSIS — F17200 Nicotine dependence, unspecified, uncomplicated: Secondary | ICD-10-CM

## 2011-04-20 MED ORDER — BUPROPION HCL ER (SR) 150 MG PO TB12: 150.0000 mg | ORAL_TABLET | Freq: Two times a day (BID) | ORAL | Status: DC

## 2011-04-20 MED ORDER — NICOTINE POLACRILEX 2 MG MT LOZG: 2.0000 mg | LOZENGE | OROMUCOSAL | Status: DC | PRN

## 2011-04-20 NOTE — Assessment & Plan Note (Signed)
moderate Nicotine Dependence of >20 years duration in a patient who is good candidate for success b/c of current level of motivation and lacks previous quit attempts.    Initiated nicotine replacement tx 2mg  mini lozenges. Patient counseled on purpose, proper use, and potential adverse effects, including   Written information provided. Provided information on 1 800-QUIT NOW support program.    Initiated bupropion SR tx 150mg  once daily, then increasing to BID. Denies history of seizures. Patient counseled on purpose, proper use, and potential adverse effects, including insomnia, and potential change in mood.   Written information provided.  F/U phone call next week.  F/U Rx Clinic Visit in January.   Total time in face-to-face counseling 60 minutes.  Patient seen with Ike Bene, PharmD Resident.

## 2011-04-20 NOTE — Patient Instructions (Signed)
1)  Start Bupropion (Wellbutrin/Zyban) 150mg  SR once daily in the AM tomorrow.  Then on Monday start two doses per day with first dose in the AM and second dose around 3:00 PM 2)  Start Nicotine mini-lozenge - Use 6-8 pieces per day.  3) QUIT Date on Monday 12/17 4)  Call 1800 QUIT NOW for additional - support.

## 2011-04-20 NOTE — Progress Notes (Signed)
Subjective:     Patient ID: Ellen Ware, female   DOB: 01-17-1966, 45 y.o.   MRN: [id number]  HPI Age when started using tobacco on a daily basis [age]. Number of Cigarettes per day 10, but previously 1 ppd smoker. Brand smoked [employer]. Estimated Nicotine Content per Cigarette (mg) 0.8.  Estimated Nicotine intake per day 8 mg.   Smokes first cigarette 15 minutes after waking.  Estimated Fagerstrom Score >6 Most recent quit attempt: never Longest time ever been tobacco free: never. What Medications (NRT, bupropion, varenicline) used in past includes none.  Rates IMPORTANCE of quitting tobacco on 1-10 scale of 10. Rates READINESS of quitting tobacco on 1-10 scale of 10 on Monday. Rates CONFIDENCE of quitting tobacco on 1-10 scale of 7-8. Triggers to use tobacco include; stress, alcohol, after meals, boredom, work breaks.    Review of Systems     Objective:   Physical Exam     Assessment:  moderate Nicotine Dependence of >20 years duration in a patient who is good candidate for success b/c of current level of motivation and lacks previous quit attempts.    Initiated nicotine replacement tx 2mg  mini lozenges. Patient counseled on purpose, proper use, and potential adverse effects, including   Written information provided. Provided information on 1 800-QUIT NOW support program.    Initiated bupropion SR tx 150mg  once daily, then increasing to BID. Denies history of seizures. Patient counseled on purpose, proper use, and potential adverse effects, including insomnia, and potential change in mood.   Written information provided.  F/U phone call next week.  F/U Rx Clinic Visit in January.   Total time in face-to-face counseling 60 minutes.  Patient seen with Ike Bene, PharmD Resident.     Plan:

## 2011-04-21 NOTE — Progress Notes (Signed)
  Subjective:    Patient ID: Ellen Ware, female    DOB: 07/31/1965, 45 y.o.   MRN: [id number]  HPI Reviewed and agree with Dr. Macky Lower management.    Review of Systems     Objective:   Physical Exam        Assessment & Plan:

## 2011-05-12 ENCOUNTER — Ambulatory Visit: Payer: PRIVATE HEALTH INSURANCE | Admitting: Pharmacist

## 2011-05-25 ENCOUNTER — Encounter: Payer: Self-pay | Admitting: Family Medicine

## 2011-05-25 ENCOUNTER — Ambulatory Visit (INDEPENDENT_AMBULATORY_CARE_PROVIDER_SITE_OTHER): Payer: BC Managed Care – PPO | Admitting: Family Medicine

## 2011-05-25 ENCOUNTER — Ambulatory Visit (HOSPITAL_COMMUNITY)
Admission: RE | Admit: 2011-05-25 | Discharge: 2011-05-25 | Disposition: A | Discharge status: Home / Self Care | Payer: BC Managed Care – PPO | Source: Ambulatory Visit | Attending: Family Medicine | Admitting: Family Medicine

## 2011-05-25 DIAGNOSIS — M79606 Pain in leg, unspecified: Secondary | ICD-10-CM | POA: Insufficient documentation

## 2011-05-25 DIAGNOSIS — I8000 Phlebitis and thrombophlebitis of superficial vessels of unspecified lower extremity: Secondary | ICD-10-CM

## 2011-05-25 DIAGNOSIS — M79609 Pain in unspecified limb: Secondary | ICD-10-CM | POA: Insufficient documentation

## 2011-05-25 DIAGNOSIS — I8001 Phlebitis and thrombophlebitis of superficial vessels of right lower extremity: Secondary | ICD-10-CM

## 2011-05-25 DIAGNOSIS — M7989 Other specified soft tissue disorders: Secondary | ICD-10-CM | POA: Insufficient documentation

## 2011-05-25 DIAGNOSIS — L539 Erythematous condition, unspecified: Secondary | ICD-10-CM | POA: Insufficient documentation

## 2011-05-25 MED ORDER — TRAMADOL HCL 50 MG PO TABS: 50.0000 mg | ORAL_TABLET | Freq: Every evening | ORAL | Status: AC | PRN

## 2011-05-25 NOTE — Patient Instructions (Addendum)
It was nice to meet you.  I am sorry you are hurting in your leg.  I think you have a superficial thrombus (blood clot) in your vein close to your skin.  I want to make sure you do not have a deep blood clot, because those can be dangerous.  Please go and have your leg ultrasound done to make sure. Your appointment is at [facility]- in the radiology department at 10 am. I will call you if the ultrasound is abnormal.   I want you to start taking one full dose asprin a day, and you can take tramadol for the pain as needed.  Please come back and see Dr. Edmonia James in about 2-3 weeks to make sure this is getting better.

## 2011-05-25 NOTE — Assessment & Plan Note (Signed)
This may be simple varicose veins, but I am concerned from the look of the vein it may be a superficial thrombus.  Her pain is significant, and she is a smoker with positive family history of DVT, will rule out DVT with LE doppler.

## 2011-05-25 NOTE — Assessment & Plan Note (Signed)
Will treat with asprin, rule out DVT.

## 2011-05-25 NOTE — Progress Notes (Signed)
  Subjective:    Patient ID: Ellen Ware, female    DOB: 1965/07/03, 46 y.o.   MRN: [id number]  HPI  Ellen Ware comes in complaining of right thigh pain that has been getting worse for about 3 weeks.  She says it is a deep ache most of the time, but sometimes it irritates her skin to touch.  She says that she has a vein on her inner thigh that is very painful to touch.  She feels like maybe her whole thigh is swollen but says "both my legs are fat so it is hard to tell."    Ellen Ware does smoke about 1/2 pack of cigarettes a day.  She states that her mother and sister have had blood clots in their legs.   Review of Systems Pertinent Items noted in HPI.      Objective:   Physical Exam BP 115/75  Pulse 97  Temp(Src) 97.9 F (36.6 C) (Oral)  Ht 5' 4.5" (1.638 m)  Wt 206 lb (93.441 kg)  BMI 34.81 kg/m2  LMP 05/14/2011 General appearance: alert, cooperative and no distress Neck: no adenopathy, no JVD, supple, symmetrical, trachea midline and thyroid not enlarged, symmetric, no tenderness/mass/nodules Lungs: clear to auscultation bilaterally Heart: regular rate and rhythm, S1, S2 normal, no murmur, click, rub or gallop Extremities: extremities normal, atraumatic, no cyanosis or edema and Homans sign is negative, no sign of DVT Pulses: 2+ and symmetric Skin: Patient has enlarged, dark purple vein on skin of medial right thigh.        Assessment & Plan:

## 2011-05-25 NOTE — Progress Notes (Signed)
VASCULAR LAB PRELIMINARY  PRELIMINARY  PRELIMINARY  PRELIMINARY  Right lower extremity venous duplex completed.    Preliminary report:  Right:  No evidence of DVT, superficial thrombosis, or Baker's cyst. Varicose veins in the distal medial thigh at the area of interest.  No thrombus.  Terance Hart, RVT 05/25/2011, 10:45 AM

## 2011-08-02 ENCOUNTER — Other Ambulatory Visit: Payer: Self-pay | Admitting: Family Medicine

## 2011-08-02 DIAGNOSIS — Z1231 Encounter for screening mammogram for malignant neoplasm of breast: Secondary | ICD-10-CM

## 2011-08-18 ENCOUNTER — Ambulatory Visit (INDEPENDENT_AMBULATORY_CARE_PROVIDER_SITE_OTHER): Payer: BC Managed Care – PPO | Admitting: Family Medicine

## 2011-08-18 ENCOUNTER — Other Ambulatory Visit (HOSPITAL_COMMUNITY)
Admission: RE | Admit: 2011-08-18 | Discharge: 2011-08-18 | Disposition: A | Discharge status: Home / Self Care | Payer: BC Managed Care – PPO | Source: Ambulatory Visit | Attending: Family Medicine | Admitting: Family Medicine

## 2011-08-18 ENCOUNTER — Encounter: Payer: Self-pay | Admitting: Family Medicine

## 2011-08-18 VITALS — BP 123/84 | HR 89 | Ht 64.5 in | Wt 199.0 lb

## 2011-08-18 DIAGNOSIS — I10 Essential (primary) hypertension: Secondary | ICD-10-CM

## 2011-08-18 DIAGNOSIS — Z202 Contact with and (suspected) exposure to infections with a predominantly sexual mode of transmission: Secondary | ICD-10-CM

## 2011-08-18 DIAGNOSIS — Z7189 Other specified counseling: Secondary | ICD-10-CM

## 2011-08-18 DIAGNOSIS — E669 Obesity, unspecified: Secondary | ICD-10-CM

## 2011-08-18 DIAGNOSIS — Z113 Encounter for screening for infections with a predominantly sexual mode of transmission: Secondary | ICD-10-CM | POA: Insufficient documentation

## 2011-08-18 DIAGNOSIS — F17200 Nicotine dependence, unspecified, uncomplicated: Secondary | ICD-10-CM

## 2011-08-18 DIAGNOSIS — E119 Type 2 diabetes mellitus without complications: Secondary | ICD-10-CM

## 2011-08-18 LAB — HIV ANTIBODY (ROUTINE TESTING W REFLEX): HIV: NONREACTIVE

## 2011-08-18 LAB — TSH: TSH: 1.441 u[IU]/mL (ref 0.350–4.500)

## 2011-08-18 LAB — BASIC METABOLIC PANEL
BUN: 11 mg/dL (ref 6–23)
Chloride: 104 mEq/L (ref 96–112)
Creat: 0.66 mg/dL (ref 0.50–1.10)

## 2011-08-18 LAB — POCT WET PREP (WET MOUNT)

## 2011-08-18 MED ORDER — LISINOPRIL-HYDROCHLOROTHIAZIDE 20-12.5 MG PO TABS: 1.0000 | ORAL_TABLET | Freq: Every day | ORAL | Status: DC

## 2011-08-18 NOTE — Progress Notes (Signed)
  Subjective:    Patient ID: Ellen Ware, female    DOB: [birth year], 46 y.o.   MRN: [id number]  HPI  PMH, Surgical history, Family History, allergies, medications and social history all updated under appropriate tabs.    Weight management- No exercise, but active job.  Diet- trying to watch what she eats, decreased soda.  Fruit and veggie- 3x day.   STD screen- Tubes tied.  Possible exposure to STD?-- has had unprotected sex.  Small amount of vaginal discharge white.  No itching.  + foul odor.  + trich in past.   BP- Well controlled, 123/84.  Taking bp medication daily.   Mammogram- Scheduled by pt already for April 23rd.    Health screening- Pt agrees to A1C screening- since family history of diabetes in 1st degree relative. TSH- since problems with weight management.  BMET- since + HTN.  Pap smear uptodate.   Cigarette smoking- Pt states she would like to quit, but doesn't feel like she can at this point due to stress and caring for her family.    Review of Systems    as per above.  Objective:   Physical Exam  Constitutional: She appears well-developed and well-nourished. No distress.  HENT:  Head: Normocephalic and atraumatic.  Right Ear: External ear normal.  Left Ear: External ear normal.  Eyes: Right eye exhibits no discharge. Left eye exhibits no discharge.  Neck: Thyromegaly (mild thyromegaly, no nodules. ) present.  Cardiovascular: Normal rate, regular rhythm and normal heart sounds.   No murmur heard. Pulmonary/Chest: Effort normal and breath sounds normal. No respiratory distress. She has no wheezes. She has no rales.  Abdominal: Soft. She exhibits no distension. There is no tenderness. There is no rebound and no guarding.  Genitourinary: Vagina normal and uterus normal. No breast swelling (no nodules), tenderness, discharge or bleeding. There is no rash, tenderness, lesion or injury on the right labia. There is no rash, tenderness, lesion or injury on  the left labia. Cervix exhibits no motion tenderness, no discharge and no friability. Right adnexum displays no mass, no tenderness and no fullness. Left adnexum displays no mass, no tenderness and no fullness.          Assessment & Plan:

## 2011-08-18 NOTE — Patient Instructions (Signed)
Weight management: Walking- 4 x per week-73min Diet- see handout Eat 3 meals per day Work on your weight goal of 170   BP: Continue blood pressure medicine.  Your blood pressure looks great.   STD screening: I will mail you the results.  Or will call if you need any medication.   Cigarette smoking: 1800quitnow Return to see me if I can help in any way.

## 2011-08-19 ENCOUNTER — Encounter: Payer: Self-pay | Admitting: Family Medicine

## 2011-08-20 ENCOUNTER — Encounter: Payer: Self-pay | Admitting: Family Medicine

## 2011-08-20 NOTE — Assessment & Plan Note (Signed)
Pt agrees to A1C screening- since family history of diabetes in 1st degree relative.  TSH- since problems with weight management.  BMET- since + HTN.  Pap smear uptodate.  Mammogram scheduled for 08/2011.

## 2011-08-20 NOTE — Assessment & Plan Note (Addendum)
Discussed importance of developing exercise and diet plan for weight loss. Goals as per pt instructions.  Pt to return in 1-2 months for weight management recheck.

## 2011-08-20 NOTE — Assessment & Plan Note (Signed)
Well controlled continue current regimen.  

## 2011-08-20 NOTE — Assessment & Plan Note (Signed)
encouarged smoking cessation.  Pt not ready to quit at this point.

## 2011-08-20 NOTE — Assessment & Plan Note (Signed)
STD screen obtained- GC/Chlam/HIV/ RPR-- pt encouraged to use condoms.

## 2011-08-21 ENCOUNTER — Encounter: Payer: Self-pay | Admitting: Family Medicine

## 2011-08-29 ENCOUNTER — Ambulatory Visit: Payer: BC Managed Care – PPO

## 2011-08-30 ENCOUNTER — Ambulatory Visit
Admission: RE | Admit: 2011-08-30 | Discharge: 2011-08-30 | Disposition: A | Discharge status: Home / Self Care | Payer: BC Managed Care – PPO | Source: Ambulatory Visit | Attending: Family Medicine | Admitting: Family Medicine

## 2011-08-30 DIAGNOSIS — Z1231 Encounter for screening mammogram for malignant neoplasm of breast: Secondary | ICD-10-CM

## 2011-12-29 ENCOUNTER — Encounter: Payer: Self-pay | Admitting: Family Medicine

## 2011-12-29 ENCOUNTER — Ambulatory Visit (INDEPENDENT_AMBULATORY_CARE_PROVIDER_SITE_OTHER): Payer: BC Managed Care – PPO | Admitting: Family Medicine

## 2011-12-29 VITALS — BP 115/69 | HR 90 | Temp 98.1°F | Ht 64.0 in | Wt 203.0 lb

## 2011-12-29 DIAGNOSIS — R2230 Localized swelling, mass and lump, unspecified upper limb: Secondary | ICD-10-CM

## 2011-12-29 DIAGNOSIS — R229 Localized swelling, mass and lump, unspecified: Secondary | ICD-10-CM

## 2011-12-29 NOTE — Patient Instructions (Signed)
It was great to see you today! I do not think that what is going on in your armpit is cancer. I want you to use warm compresses on it two times daily for the next two weeks.  If it has not gotten better by then, give a call and ask the staff to send a phone note to Dr. Louanne Belton that it is not better.

## 2011-12-29 NOTE — Progress Notes (Signed)
Patient ID: Ellen Ware, female   DOB: [birth year], 46 y.o.   MRN: [id number] Subjective: The patient is a 46 y.o. year old female who presents today for 2 issues  1. Cold: began as slight cough 1 week ago, now with some nasal congestion, continued cough (mildly productive).  No SOB/DOE, no fevers/chills, good appetite.  Taking niquil and mucinex.  2. Axilla swelling: Noticed area of swelling in right axilla about 3 days ago.  Not getting worse, not getting better.  No treatment.  No prior history, although patient reports may have had this for some time and she doesn't realize it.  Normal mamo 4 months ago.  No breast changes  Patient's past medical, social, and family history were reviewed and updated as appropriate. History  Substance Use Topics  . Smoking status: Current Everyday Smoker -- 0.5 packs/day    Types: Cigarettes  . Smokeless tobacco: Not on file  . Alcohol Use: Yes     1-2 beers per day,    Objective:  Filed Vitals:   12/29/11 0906  BP: 115/69  Pulse: 90  Temp: 98.1 F (36.7 C)   Gen: NAD CV: RRR Resp: CTABL Breast: No dominent masses, fibrocystic changes.  No skin changes. No nipple retractions. Axilla: Area of fullness without clear borders present in the right axilla.  Mobile, soft.  No overlying skin changes.  Assessment/Plan: Viral URI: Conservative management for now.  No signs of infection.  Counsiled smoking cessation. Please also see individual problems in problem list for problem-specific plans.

## 2011-12-29 NOTE — Assessment & Plan Note (Signed)
Lipoma vs early abscess.  Doubt cancer given exam and normal mamo.  Will rec 2 weeks of compresses.  If not resolved will plan to try US guided biopsy to establish diagnosis.

## 2012-08-16 ENCOUNTER — Encounter: Payer: BC Managed Care – PPO | Admitting: Family Medicine

## 2012-09-02 ENCOUNTER — Other Ambulatory Visit: Payer: Self-pay

## 2012-09-02 DIAGNOSIS — Z1231 Encounter for screening mammogram for malignant neoplasm of breast: Secondary | ICD-10-CM

## 2012-09-03 ENCOUNTER — Ambulatory Visit (INDEPENDENT_AMBULATORY_CARE_PROVIDER_SITE_OTHER): Payer: BC Managed Care – PPO | Admitting: Family Medicine

## 2012-09-03 ENCOUNTER — Encounter: Payer: Self-pay | Admitting: Family Medicine

## 2012-09-03 ENCOUNTER — Other Ambulatory Visit (HOSPITAL_COMMUNITY)
Admission: RE | Admit: 2012-09-03 | Discharge: 2012-09-03 | Disposition: A | Discharge status: Home / Self Care | Payer: BC Managed Care – PPO | Source: Ambulatory Visit | Attending: Family Medicine | Admitting: Family Medicine

## 2012-09-03 VITALS — BP 101/73 | HR 90 | Temp 98.6°F | Ht 64.5 in | Wt 202.0 lb

## 2012-09-03 DIAGNOSIS — N760 Acute vaginitis: Secondary | ICD-10-CM

## 2012-09-03 DIAGNOSIS — N921 Excessive and frequent menstruation with irregular cycle: Secondary | ICD-10-CM

## 2012-09-03 DIAGNOSIS — Z113 Encounter for screening for infections with a predominantly sexual mode of transmission: Secondary | ICD-10-CM | POA: Insufficient documentation

## 2012-09-03 DIAGNOSIS — R2231 Localized swelling, mass and lump, right upper limb: Secondary | ICD-10-CM

## 2012-09-03 DIAGNOSIS — N920 Excessive and frequent menstruation with regular cycle: Secondary | ICD-10-CM

## 2012-09-03 DIAGNOSIS — R229 Localized swelling, mass and lump, unspecified: Secondary | ICD-10-CM

## 2012-09-03 LAB — POCT WET PREP (WET MOUNT)

## 2012-09-03 LAB — CBC
MCV: 84.5 fL (ref 78.0–100.0)
Platelets: 266 10*3/uL (ref 150–400)
RBC: 4.38 MIL/uL (ref 3.87–5.11)
WBC: 7.5 10*3/uL (ref 4.0–10.5)

## 2012-09-03 LAB — POCT GLYCOSYLATED HEMOGLOBIN (HGB A1C): Hemoglobin A1C: 5.9

## 2012-09-03 NOTE — Patient Instructions (Addendum)
Consider smoking cessation. This is the most important thing you can do for your health. Get your breast ultrasound and mammogram to rule out dangerous lesions. I will call if labs abnormal. Need pap smear in one year.

## 2012-09-04 ENCOUNTER — Encounter: Payer: Self-pay | Admitting: Family Medicine

## 2012-09-04 ENCOUNTER — Telehealth: Payer: Self-pay | Admitting: Family Medicine

## 2012-09-04 LAB — COMPREHENSIVE METABOLIC PANEL
ALT: 14 U/L (ref 0–35)
CO2: 29 mEq/L (ref 19–32)
Calcium: 9.4 mg/dL (ref 8.4–10.5)
Chloride: 102 mEq/L (ref 96–112)
Sodium: 140 mEq/L (ref 135–145)
Total Protein: 7.2 g/dL (ref 6.0–8.3)

## 2012-09-04 MED ORDER — POTASSIUM CHLORIDE CRYS ER 20 MEQ PO TBCR: 20.0000 meq | EXTENDED_RELEASE_TABLET | Freq: Every day | ORAL | Status: DC

## 2012-09-04 MED ORDER — METRONIDAZOLE 500 MG PO TABS: 500.0000 mg | ORAL_TABLET | Freq: Two times a day (BID) | ORAL | Status: DC

## 2012-09-04 NOTE — Progress Notes (Signed)
Subjective:     Ellen Ware is a 47 y.o. female and is here for a comprehensive physical exam. The patient reports problems - with spotting between periods and having cycles every 2-3 weeks. she does report unprotected sex, but is monogamous. Does have a discharge and odor. Having some menstrual cramping also.  no pain with intercourse, weight changes, nausea, fever, chills.   She continues to report a right axillary lump that was evaluated last year. Patient was told to follow up if this did not resolve, however, she failed to do so until now. Size is stable, sometimes is tender. Has not noticed any additional breast lumps. Plans to get mammogram next month.   LMP 08/20/12.  Last pap in 08/2010 was normal.  S/p BTL. Active smoker, not interested in cessation today.  History   Social History  . Marital Status: Divorced    Spouse Name: N/A    Number of Children: N/A  . Years of Education: N/A   Occupational History  . Not on file.   Social History Main Topics  . Smoking status: Current Every Day Smoker -- 1.00 packs/day    Types: Cigarettes  . Smokeless tobacco: Not on file  . Alcohol Use: Yes     Comment: 1-2 beers per week,   . Drug Use: Yes    Special: Marijuana  . Sexually Active: Yes     Comment: tubal   Other Topics Concern  . 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   Health Maintenance  Topic Date Due  . Influenza Vaccine  01/06/2013  . Pap Smear  08/08/2013  . Tetanus/tdap  12/10/2013    The following portions of the patient's history were reviewed and updated as appropriate: allergies, current medications, past family history, past medical history, past social history, past surgical history and problem list.  Review of Systems Pertinent items are noted in HPI, otherwise neatie except for muscle cramps and some generalized weakness and dizziness.   Objective:     BP 101/73  Pulse 90  Temp(Src) 98.6 F (37 C) (Oral)  Ht 5' 4.5" (1.638 m)  Wt 202 lb (91.627 kg)  BMI 34.15 kg/m2  LMP 08/20/2012 General appearance: alert, cooperative, appears stated age and no distress Head: Normocephalic, without obvious abnormality, atraumatic Eyes: conjunctivae/corneas clear. PERRL, EOM's intact. Nose: Nares normal. Septum midline. Mucosa normal. No drainage or sinus tenderness. Throat: lips, mucosa, and tongue normal; teeth and gums normal Neck: no adenopathy and thyroid not enlarged, symmetric, no tenderness/mass/nodules Lungs: clear to auscultation bilaterally Breasts: positive findings: skin dimpling on the right inferior to nipple and 1-2 cm and soft nodule located on the right just inferior to nipple. another 1-2 cm soft, slightly tender nodule palpated on right axillae. no surrounding induration or skin changes. Heart: regular rate and rhythm, S1, S2 normal, no murmur, click, rub or gallop Abdomen: soft, non-tender; bowel sounds normal; no masses,  no organomegaly Pelvic: cervix normal in appearance, external genitalia normal, no adnexal masses or tenderness, no cervical motion tenderness, rectovaginal septum normal, uterus normal size, shape, and consistency and fishy odor and scant brown discharge noted. Extremities: extremities normal, atraumatic, no cyanosis or edema Pulses: 2+ and symmetric Neurologic: Grossly normal    Assessment:    Healthy female exam. Tobacco abuse. Axillary and right breast nodule. Body mass index is 34.15 kg/(m^2). Irregular  vaginal bleeding.        Plan:       Tobacco abuse. Patient refuses counseling or cessation materials today. Informed her this is most important thing she can do to prevent health issues.  Persistent axillary nodule and right breast nodule. Diagnostic mammogram and axillary [place] ordered. Differential includes fibrocystic vs lymph node vs malignancy.  Irregular menstrual bleeding. Check labs today.  Paps normal in past years, cervix appears normal. Screen for STDs. Consider peri-menopause vs obesity related hormonal changes.

## 2012-09-04 NOTE — Telephone Encounter (Signed)
Related message,pt voiced understanding. Kern Gingras S  

## 2012-09-04 NOTE — Telephone Encounter (Signed)
Please inform labs normal except potassium slightly low. Take supplement I sent and she has Bacterial vaginosis, start flagyl. Do not drink alcohol or will be ill. Please follow up in clinic if symptoms do not improve after treatment.

## 2012-09-12 ENCOUNTER — Emergency Department (HOSPITAL_COMMUNITY)
Admission: EM | Admit: 2012-09-12 | Discharge: 2012-09-13 | Disposition: A | Discharge status: Home / Self Care | Payer: BC Managed Care – PPO | Attending: Emergency Medicine | Admitting: Emergency Medicine

## 2012-09-12 ENCOUNTER — Telehealth: Payer: Self-pay | Admitting: Family Medicine

## 2012-09-12 ENCOUNTER — Encounter (HOSPITAL_COMMUNITY): Payer: Self-pay | Admitting: *Deleted

## 2012-09-12 DIAGNOSIS — R42 Dizziness and giddiness: Secondary | ICD-10-CM | POA: Insufficient documentation

## 2012-09-12 DIAGNOSIS — Z8679 Personal history of other diseases of the circulatory system: Secondary | ICD-10-CM | POA: Insufficient documentation

## 2012-09-12 DIAGNOSIS — Z79899 Other long term (current) drug therapy: Secondary | ICD-10-CM | POA: Insufficient documentation

## 2012-09-12 DIAGNOSIS — F17200 Nicotine dependence, unspecified, uncomplicated: Secondary | ICD-10-CM | POA: Insufficient documentation

## 2012-09-12 DIAGNOSIS — I10 Essential (primary) hypertension: Secondary | ICD-10-CM | POA: Insufficient documentation

## 2012-09-12 DIAGNOSIS — Z85828 Personal history of other malignant neoplasm of skin: Secondary | ICD-10-CM | POA: Insufficient documentation

## 2012-09-12 DIAGNOSIS — Z3202 Encounter for pregnancy test, result negative: Secondary | ICD-10-CM | POA: Insufficient documentation

## 2012-09-12 HISTORY — DX: Essential (primary) hypertension: I10

## 2012-09-12 LAB — POCT I-STAT, CHEM 8
Glucose, Bld: 99 mg/dL (ref 70–99)
HCT: 39 % (ref 36.0–46.0)
Hemoglobin: 13.3 g/dL (ref 12.0–15.0)
Potassium: 3.9 mEq/L (ref 3.5–5.1)
Sodium: 139 mEq/L (ref 135–145)

## 2012-09-12 LAB — URINALYSIS, ROUTINE W REFLEX MICROSCOPIC
Bilirubin Urine: NEGATIVE
Nitrite: NEGATIVE
Specific Gravity, Urine: 1.011 (ref 1.005–1.030)
Urobilinogen, UA: 0.2 mg/dL (ref 0.0–1.0)

## 2012-09-12 NOTE — Telephone Encounter (Signed)
Patient is calling because she wants to speak tot he nurse about some symptoms she is having that are concerning to her.  She has felt dizzy and light headed and generally not right and she doesn't know if it is her blood pressure or blood sugar causing this.

## 2012-09-12 NOTE — ED Provider Notes (Signed)
History     CSN: [id number]  Arrival date & time 09/12/12  2222   First MD Initiated Contact with Patient 09/12/12 2249      Chief Complaint  Patient presents with  . Dizziness   HPI  History provided by the patient. Patient is a 47 year old female with history of hypertension who presents with complaints of episodes of lightheadedness for the past week. Symptoms have been intermittent and described as brief episodes of feeling lightheaded and weak. Symptoms may, at any time even while sitting and resting without movements. They do not last more than 1-2 minutes. They have not been associated with any chest pain or shortness of breath the patient occasionally feels some palpitations but these are not always associated with lightheadedness feelings. Patient denies any other changes recently. She does state that she has noticed some low blood pressures. She contacted her PCP who recommended taking her medicine 97 the morning but this has not helped. She states blood pressures have been below 100 systolic on times. She denies any recent change in diet or appetite. She states she's been drinking plain fluids. Denies heavy caffeine use. Patient does mention that she had a physical 2 weeks ago was noted to have slightly low potassium and took potassium pills for 5 days. No other recent changes. No fever, chills or sweats. No swelling or numbness in extremities.    Past Medical History  Diagnosis Date  . VARICOSE VEINS, LOWER EXTREMITIES 08/13/2009    Qualifier: Diagnosis of  By: Georgiana Shore  MD, Vernona Rieger    . Lipoma 08/11/2010  . Hypertension     Past Surgical History  Procedure Laterality Date  . Tubal ligation    . Cosmetic surgery    . Cesarean section      x 2    Family History  Problem Relation Age of Onset  . Hypertension Mother   . Heart disease Mother   . Diabetes Mother   . Arthritis Mother     History  Substance Use Topics  . Smoking status: Current Every Day Smoker -- 0.50  packs/day    Types: Cigarettes  . Smokeless tobacco: Not on file  . Alcohol Use: Yes     Comment: 1-2 beers per week,     OB History   Grav Para Term Preterm Abortions TAB SAB Ect Mult Living                  Review of Systems  Constitutional: Negative for fever, chills, diaphoresis, appetite change and unexpected weight change.  Respiratory: Negative for shortness of breath.   Cardiovascular: Negative for chest pain.  Gastrointestinal: Negative for nausea and vomiting.  Endocrine: Negative for polydipsia and polyuria.  Genitourinary: Negative for dysuria, frequency, hematuria and flank pain.  Neurological: Positive for light-headedness. Negative for weakness and numbness.  All other systems reviewed and are negative.    Allergies  Penicillins  Home Medications   Current Outpatient Rx  Name  Route  Sig  Dispense  Refill  . lisinopril-hydrochlorothiazide (PRINZIDE,ZESTORETIC) 20-12.5 MG per tablet   Oral   Take 1 tablet by mouth daily.   90 tablet   4   . metroNIDAZOLE (FLAGYL) 500 MG tablet   Oral   Take 1 tablet (500 mg total) by mouth 2 (two) times daily.   14 tablet   0   . EXPIRED: nicotine polacrilex (NICORETTE MINI) 2 MG lozenge   Buccal   Place 1 lozenge (2 mg total) inside cheek as needed  for smoking cessation.   100 tablet   5   . potassium chloride SA (K-DUR,KLOR-CON) 20 MEQ tablet   Oral   Take 1 tablet (20 mEq total) by mouth daily.   5 tablet   0     BP 104/68  Pulse 74  Temp(Src) 98.6 F (37 C) (Oral)  Resp 20  Wt 202 lb 4 oz (91.74 kg)  BMI 34.19 kg/m2  SpO2 95%  LMP 08/20/2012  Physical Exam  Nursing note and vitals reviewed. Constitutional: She is oriented to person, place, and time. She appears well-developed and well-nourished. No distress.  HENT:  Head: Normocephalic.  Neck: Normal range of motion. Neck supple.  Cardiovascular: Normal rate, regular rhythm and intact distal pulses.   No murmur heard. Pulmonary/Chest:  Effort normal and breath sounds normal. No respiratory distress. She has no wheezes. She has no rales.  Abdominal: Soft.  Musculoskeletal: Normal range of motion. She exhibits no edema and no tenderness.  Neurological: She is alert and oriented to person, place, and time.  Skin: Skin is warm and dry. No rash noted.  Psychiatric: She has a normal mood and affect. Her behavior is normal.    ED Course  Procedures   Results for orders placed during the hospital encounter of 09/12/12  URINALYSIS, ROUTINE W REFLEX MICROSCOPIC      Result Value Range   Color, Urine YELLOW  YELLOW   APPearance CLEAR  CLEAR   Specific Gravity, Urine 1.011  1.005 - 1.030   pH 6.5  5.0 - 8.0   Glucose, UA NEGATIVE  NEGATIVE mg/dL   Hgb urine dipstick NEGATIVE  NEGATIVE   Bilirubin Urine NEGATIVE  NEGATIVE   Ketones, ur NEGATIVE  NEGATIVE mg/dL   Protein, ur NEGATIVE  NEGATIVE mg/dL   Urobilinogen, UA 0.2  0.0 - 1.0 mg/dL   Nitrite NEGATIVE  NEGATIVE   Leukocytes, UA NEGATIVE  NEGATIVE  POCT I-STAT, CHEM 8      Result Value Range   Sodium 139  135 - 145 mEq/L   Potassium 3.9  3.5 - 5.1 mEq/L   Chloride 102  96 - 112 mEq/L   BUN 8  6 - 23 mg/dL   Creatinine, Ser 1.61  0.50 - 1.10 mg/dL   Glucose, Bld 99  70 - 99 mg/dL   Calcium, Ion 0.96  0.45 - 1.23 mmol/L   TCO2 29  0 - 100 mmol/L   Hemoglobin 13.3  12.0 - 15.0 g/dL   HCT 40.9  81.1 - 91.4 %  POCT PREGNANCY, URINE      Result Value Range   Preg Test, Ur NEGATIVE  NEGATIVE     1. Lightheadedness       MDM  10:50 PM patient seen and evaluated. Patient sitting and resting comfortably appears in no acute distress. Currently without any symptoms or complaints.        Angus Seller, PA-C 09/13/12 [phone]

## 2012-09-12 NOTE — Telephone Encounter (Signed)
Pt reports feeling dizzy lately and BP running low lately (under 100 systolic) - instructed to keep record of Blood pressure, drink plenty of fluids & regular meals. Also suggested taking BP at night. If not feeling better by Monday to call and make appointment first thing on Monday. Wyatt Haste, RN-BSN

## 2012-09-12 NOTE — ED Notes (Signed)
Pt states that she has been dizzy and lightheaded off and on x 1 week; pt states that she has been felt more dizzy and feeling like she might pass out for the last 2 days; pt called PCP and they advised that she take her BP medicine and night instead of the am but pt has not changed the way she is taking her medication. MD advised her today that if pt is still feeling bad to come on Monday to eval if BP med needed changed; pt felt like she could not wait that long.

## 2012-09-13 ENCOUNTER — Ambulatory Visit
Admission: RE | Admit: 2012-09-13 | Discharge: 2012-09-13 | Disposition: A | Discharge status: Home / Self Care | Payer: BC Managed Care – PPO | Source: Ambulatory Visit | Attending: Family Medicine | Admitting: Family Medicine

## 2012-09-13 DIAGNOSIS — R2231 Localized swelling, mass and lump, right upper limb: Secondary | ICD-10-CM

## 2012-09-16 NOTE — ED Provider Notes (Signed)
Medical screening examination/treatment/procedure(s) were performed by non-physician practitioner and as supervising physician I was immediately available for consultation/collaboration.    Vida Roller, MD 09/16/12 2253

## 2012-09-24 ENCOUNTER — Ambulatory Visit: Payer: BC Managed Care – PPO

## 2012-10-14 ENCOUNTER — Other Ambulatory Visit (HOSPITAL_COMMUNITY)
Admission: RE | Admit: 2012-10-14 | Discharge: 2012-10-14 | Disposition: A | Discharge status: Home / Self Care | Payer: BC Managed Care – PPO | Source: Ambulatory Visit | Attending: Family Medicine | Admitting: Family Medicine

## 2012-10-14 ENCOUNTER — Encounter: Payer: Self-pay | Admitting: Family Medicine

## 2012-10-14 ENCOUNTER — Ambulatory Visit (INDEPENDENT_AMBULATORY_CARE_PROVIDER_SITE_OTHER): Payer: BC Managed Care – PPO | Admitting: Family Medicine

## 2012-10-14 VITALS — BP 103/78 | HR 135 | Ht 64.5 in | Wt 206.3 lb

## 2012-10-14 DIAGNOSIS — N898 Other specified noninflammatory disorders of vagina: Secondary | ICD-10-CM

## 2012-10-14 DIAGNOSIS — N760 Acute vaginitis: Secondary | ICD-10-CM

## 2012-10-14 DIAGNOSIS — Z113 Encounter for screening for infections with a predominantly sexual mode of transmission: Secondary | ICD-10-CM | POA: Insufficient documentation

## 2012-10-14 LAB — POCT WET PREP (WET MOUNT): Clue Cells Wet Prep Whiff POC: POSITIVE

## 2012-10-14 NOTE — Patient Instructions (Addendum)
It was nice to meet you today, Ellen Ware. Please refer to handout below regarding common causes of vaginitis. It typically takes about 1-2 days for results to return. If today's lab results are ABNORMAL, we will call you and send medication to your pharmacy.   If you develop worsening symptoms, fever (temperature > 101.5 degrees), nausea/vomiting, or pelvic pain, please return to clinic.  What is vaginitis? Vaginitis is an inflammation of the vagina. Vulvovaginitis refers to inflammation of both the vagina and vulva (the external female genitals).   What are the most common types of vaginitis? The six most common types of vaginitis are:  Candida or "yeast" vaginitis  Bacterial vaginosis  Trichomoniasis vaginitis (a sexually transmitted infection)   What are candida or "yeast" infections? Yeast infections of the vagina are what most women think of when they hear the term "vaginitis." Yeast infections are caused by one of the many species of fungus called candida. Candida normally live in small numbers in the vagina, as well as in the mouth and digestive tract of both men and women.  Yeast infections produce a thick, white vaginal discharge with the consistency of cottage cheese. Although the discharge can be somewhat watery, it is odorless. Yeast infections usually cause the vagina and the vulva to be very itchy and red, even before the onset of discharge.  If yeast is normal in a woman's vagina, what makes it cause an infection? Usually, infection occurs when a change in the delicate balance in a woman's system takes place.   What is bacterial vaginosis? Although "yeast" is the name most women know, bacterial vaginosis (BV) actually is the most common vaginal infection in women of reproductive age. Bacterial vaginosis often will cause a vaginal discharge. The discharge usually is thin and milky, and is described as having a "fishy" odor. This odor may become more noticeable after intercourse.   Redness or itching of the vagina are not common symptoms of bacterial vaginosis. Some women with BV have no symptoms at all, and the vaginitis is only discovered during a routine gynecologic exam. Bacterial vaginosis is caused by a combination of several bacteria. These bacteria seem to overgrow in much the same way as do candida when the vaginal pH balance is upset.  Because bacterial vaginosis is caused by bacteria and not by yeast, medicine that is appropriate for yeast is not effective against the bacteria that cause bacterial vaginosis.  BV is treated in asymptomatic females of child bearing age to decrease their risk of preterm delivery.  Risk factors for BV include:  New or multiple sexual partners  Douching  Cigarette smoking  What is trichomoniasis? Trichomoniasis - Trichomoniasis is caused by a tiny single-celled organism known as a "protozoa." When this organism infects the vagina, it can cause a frothy, greenish-yellow discharge. Often this discharge will have a foul smell. Women with trichomonal vaginitis may complain of itching and soreness of the vagina and vulva, as well as burning during urination. In addition, there can be discomfort in the lower abdomen and vaginal pain with intercourse. These symptoms may be worse after the menstrual period. Many women, however, do not develop any symptoms. It is important to understand that this type of vaginitis can be transmitted through sexual intercourse. For treatment to be effective, the sexual partner must be treated at the same time as the patient. \

## 2012-10-14 NOTE — Assessment & Plan Note (Signed)
Sent wet prep and GC/chlamydia to lab.  Will notify of results.  Discussed importance of condom use.  Follow up as needed.

## 2012-10-14 NOTE — Progress Notes (Signed)
  Subjective:    Patient ID: Ellen Ware, female    DOB: August 02, 1965, 47 y.o.   MRN: [id number]  HPI  Patient presents to same day appointment for vaginal discharge and foul odor.  This started about one week ago.  She had sexual intercourse about 2 weeks ago.  She used condoms, but she says his hands touched her vagina and his hands were dirty.  She wants to be checked for STD today.  Patient has had BV in the past and treated for it.  She endorses mild pelvic pain, but no back pain, fever, chills, or nausea/vomiting.  Denies any dysuria or burning with urination.  Review of Systems Per HPI    Objective:   Physical Exam  Constitutional: No distress.  Abdominal: Soft. There is no tenderness. There is no rebound and no guarding.  Genitourinary: Cervix exhibits discharge. Cervix exhibits no motion tenderness and no friability. No erythema, tenderness or bleeding around the vagina. No signs of injury around the vagina. Vaginal discharge found.      Assessment & Plan:

## 2012-10-15 ENCOUNTER — Telehealth: Payer: Self-pay | Admitting: Family Medicine

## 2012-10-15 MED ORDER — METRONIDAZOLE 500 MG PO TABS: 500.0000 mg | ORAL_TABLET | Freq: Two times a day (BID) | ORAL | Status: DC

## 2012-10-15 NOTE — Addendum Note (Signed)
Addended by: Tye Savoy, Joahan Swatzell on: 10/15/2012 08:25 PM   Modules accepted: Orders

## 2012-10-15 NOTE — Telephone Encounter (Signed)
Hi team, will you please call Ellen Ware and tell her results were positive for BV and Flagyl was sent to pharmacy today.  Thank you!

## 2012-10-16 NOTE — Telephone Encounter (Signed)
Pt informed of test result and med at pharmacy.

## 2012-10-18 ENCOUNTER — Other Ambulatory Visit: Payer: Self-pay | Admitting: Family Medicine

## 2012-11-05 ENCOUNTER — Telehealth: Payer: Self-pay | Admitting: Family Medicine

## 2012-11-05 ENCOUNTER — Encounter: Payer: Self-pay | Admitting: Family Medicine

## 2012-11-05 ENCOUNTER — Ambulatory Visit (INDEPENDENT_AMBULATORY_CARE_PROVIDER_SITE_OTHER): Payer: BC Managed Care – PPO | Admitting: Family Medicine

## 2012-11-05 VITALS — BP 131/81 | HR 83 | Temp 98.9°F | Ht 64.5 in | Wt 205.7 lb

## 2012-11-05 DIAGNOSIS — H00029 Hordeolum internum unspecified eye, unspecified eyelid: Secondary | ICD-10-CM

## 2012-11-05 DIAGNOSIS — H00023 Hordeolum internum right eye, unspecified eyelid: Secondary | ICD-10-CM | POA: Insufficient documentation

## 2012-11-05 NOTE — Assessment & Plan Note (Signed)
Recommend frequent warm compresses (at least 4 times a day; 15 mins each) Patient advised that if does not improve, she should return for possible referral to Opthalmology for I/D.

## 2012-11-05 NOTE — Patient Instructions (Addendum)
Please used warm compresses frequently throughout the day (15 mins each at least 4 times a day).  If it does not improve in ~48 hours please return, as you may need and opthalmology referral for Incision/drainage.

## 2012-11-05 NOTE — Progress Notes (Signed)
Subjective:     Patient ID: Ellen Ware, female   DOB: [birth year], 47 y.o.   MRN: [id number]  HPI 47 year old female presents today for evaluation of right eye swelling and redness. Patient reports that the area underneath her lower eyelid (right eye) has been swollen, red, and painful x 3 days.  It began suddenly.  No known exposures, trauma, or potential foreign body.  Patient has not tried anything to aid her symptoms.  No loss of vision although it is somewhat difficult to look downward given swelling.  She denies associated eye redness, fevers, chills, purulent drainage, eye crusting.   Review of Systems Per HPI    Objective:   Physical Exam Filed Vitals:   11/05/12 1620  BP: 131/81  Pulse: 83  Temp: 98.9 F (37.2 C)   HEENT: Eyes - left eye normal with no swelling, erythema, or conjunctiva injection.   Right eye - swelling below lower eye lid.  Retraction of lower eyelid revealed erythema and a small white lesion (internal hordeolum)      Assessment:   See Problem list    Plan:

## 2012-11-12 ENCOUNTER — Ambulatory Visit (INDEPENDENT_AMBULATORY_CARE_PROVIDER_SITE_OTHER): Payer: BC Managed Care – PPO | Admitting: *Deleted

## 2012-11-12 DIAGNOSIS — Z111 Encounter for screening for respiratory tuberculosis: Secondary | ICD-10-CM

## 2012-11-12 NOTE — Progress Notes (Signed)
PPD Placement note Ellen Ware, 47 y.o. female is here today for placement of PPD test Reason for PPD test:work Pt taken PPD test before: yes Verified in allergy area and with patient that they are not allergic to the products PPD is made of (Phenol or Tween). Yes Is patient taking any oral or IV steroid medication now or have they taken it in the last month? no Has the patient ever received the BCG vaccine?: no Has the patient been in recent contact with anyone known or suspected of having active TB disease?: no      O: Alert and oriented in NAD. P:  PPD placed on 11/12/2012.  Patient advised to return for reading within 48-72 hours. Wyatt Haste, RN-BSN

## 2012-11-14 ENCOUNTER — Ambulatory Visit: Payer: BC Managed Care – PPO | Admitting: *Deleted

## 2012-11-14 ENCOUNTER — Encounter: Payer: Self-pay | Admitting: *Deleted

## 2012-11-14 DIAGNOSIS — Z111 Encounter for screening for respiratory tuberculosis: Secondary | ICD-10-CM

## 2012-11-14 NOTE — Progress Notes (Signed)
PPD Reading Note PPD read and results entered in Epic[employer]. Result:0 mm induration. Interpretation: negative Letter given verifiying results Wyatt Haste, RN-BSN

## 2012-11-20 NOTE — Telephone Encounter (Signed)
error 

## 2013-05-21 ENCOUNTER — Other Ambulatory Visit: Payer: Self-pay | Admitting: Family Medicine

## 2013-05-21 DIAGNOSIS — I10 Essential (primary) hypertension: Secondary | ICD-10-CM

## 2013-05-21 NOTE — Telephone Encounter (Signed)
HTN medication refilled, though she has not had documented high blood pressure in > 1 yr. I will need to have an office visit with her prior to further refills for labs and counseling about teratogenicity.

## 2013-05-21 NOTE — Telephone Encounter (Signed)
Left message on patients sister voicemail to have her return my call. Buck Mcaffee, Lewie Loron

## 2013-05-23 ENCOUNTER — Other Ambulatory Visit: Payer: Self-pay | Admitting: Family Medicine

## 2013-05-25 ENCOUNTER — Other Ambulatory Visit: Payer: Self-pay | Admitting: Family Medicine

## 2013-07-31 ENCOUNTER — Other Ambulatory Visit: Payer: Self-pay | Admitting: Obstetrics & Gynecology

## 2013-07-31 DIAGNOSIS — R2231 Localized swelling, mass and lump, right upper limb: Secondary | ICD-10-CM

## 2013-08-08 ENCOUNTER — Other Ambulatory Visit: Payer: BC Managed Care – PPO

## 2013-08-11 ENCOUNTER — Ambulatory Visit
Admission: RE | Admit: 2013-08-11 | Discharge: 2013-08-11 | Disposition: A | Discharge status: Home / Self Care | Payer: No Typology Code available for payment source | Source: Ambulatory Visit | Attending: Obstetrics & Gynecology | Admitting: Obstetrics & Gynecology

## 2013-08-11 ENCOUNTER — Ambulatory Visit
Admission: RE | Admit: 2013-08-11 | Discharge: 2013-08-11 | Disposition: A | Discharge status: Home / Self Care | Payer: Self-pay | Source: Ambulatory Visit | Attending: Obstetrics & Gynecology | Admitting: Obstetrics & Gynecology

## 2013-08-11 DIAGNOSIS — R2231 Localized swelling, mass and lump, right upper limb: Secondary | ICD-10-CM

## 2015-02-22 IMAGING — MG MM DIAGNOSTIC BILATERAL
6 series · 6 of 6 positions shown · non-contrast
Comparison: Previous examinations.

CLINICAL DATA: Nodular density and indentation in the 6 o'clock
position of the right breast near the nipple on recent physical
examination.  The patient also has a palpable right axillary mass
which she reports has been present for the past year and may have
increased in size during that time.

DIGITAL DIAGNOSTIC BILATERAL MAMMOGRAM WITH CAD AND RIGHT BREAST
ULTRASOUND:

[R CC]
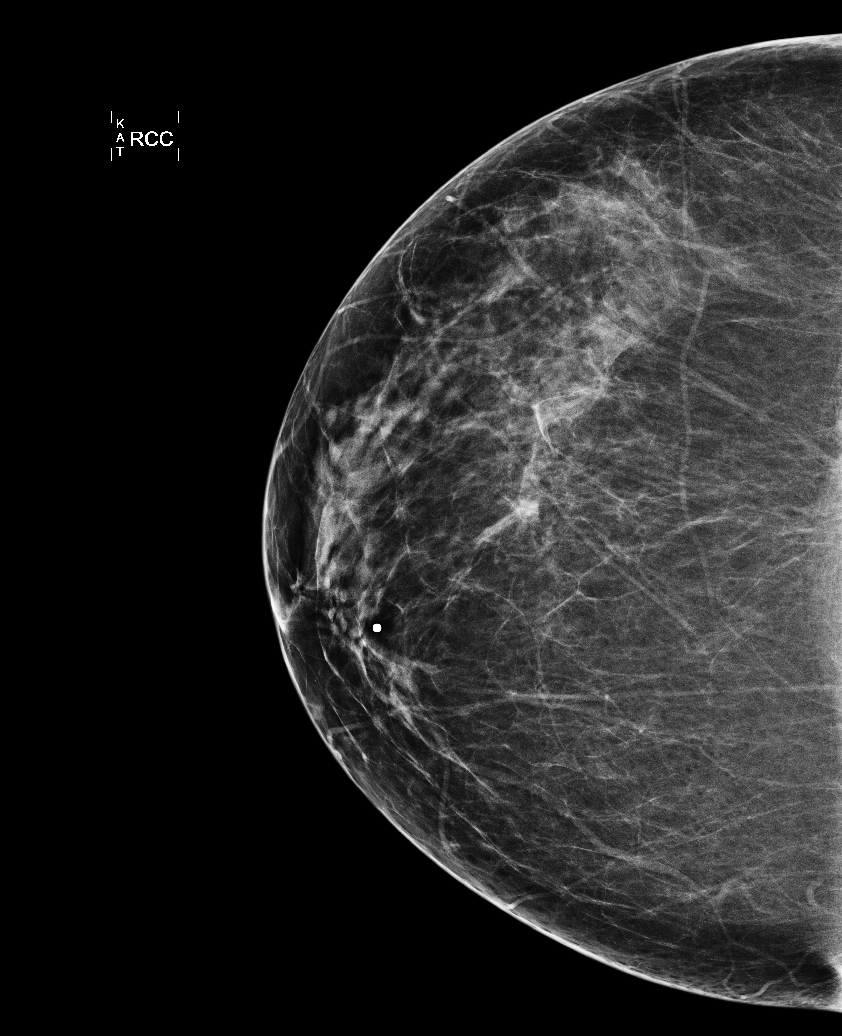

[L CC]
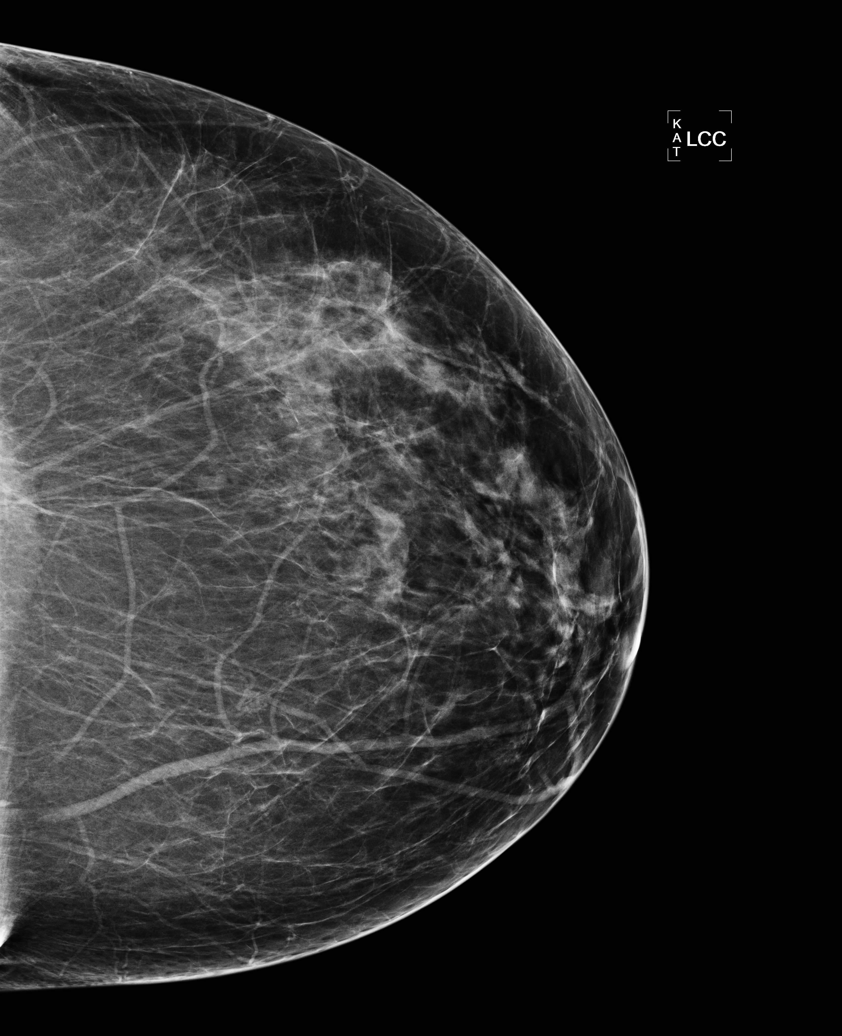

[L MLO]
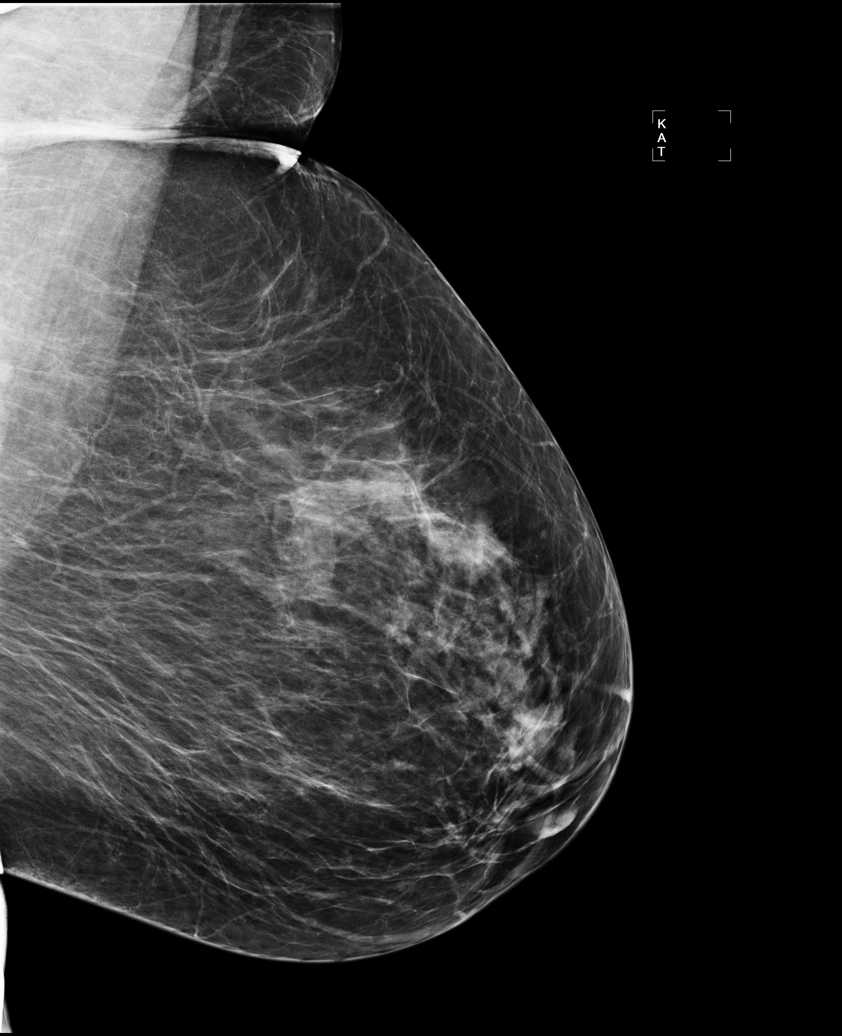

[R MLO]
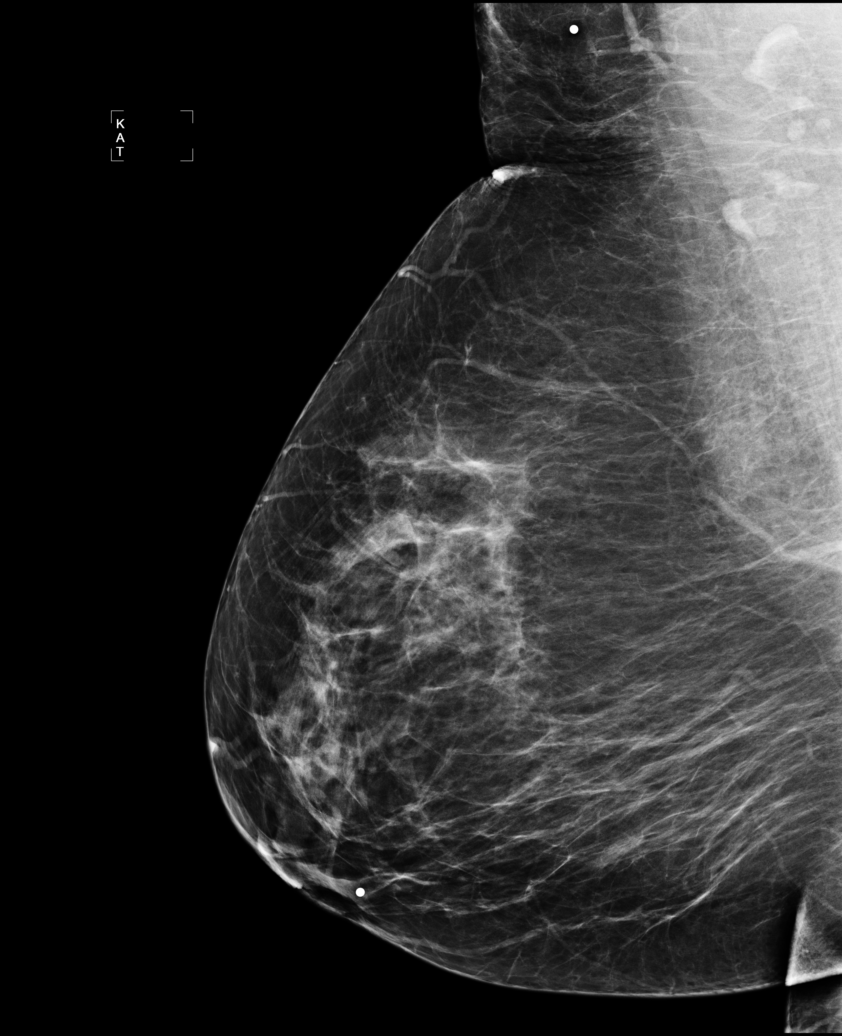

[R TAN (1 of 2)]
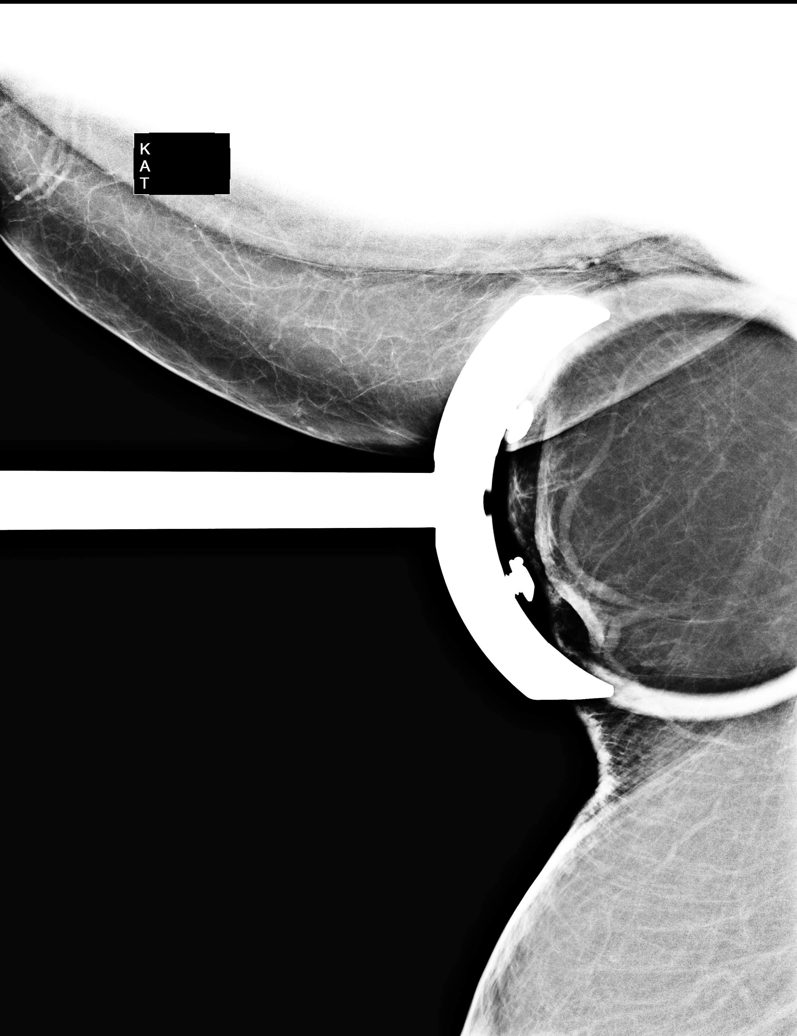

[R TAN (2 of 2)]
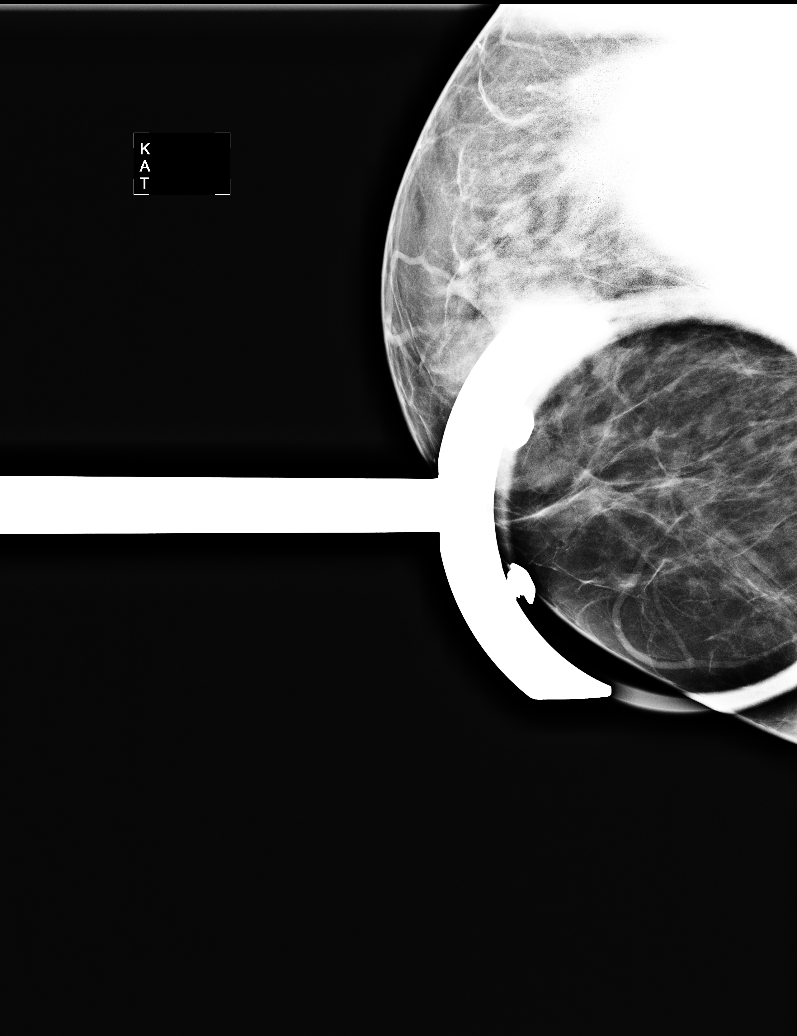

[6 of 6 positions shown; findings below may reference images not displayed]

FINDINGS: ACR Breast Density Category 2: There is a scattered fibroglandular
pattern.

Stable mammographic appearance of both breasts with no findings
suspicious for malignancy.

Mammographic images were processed with CAD.

On physical exam, the patient has an approximately 15 x 8 mm
horizontally oriented palpable ridge of soft tissue thickening in
the 6 o'clock periareolar region of the right breast.  There is
also indentation of the nipple and areola above this ridge of
tissue.  She also has prominent fat pads in both axillae.  The fat
pad on the right is somewhat more protuberant and firmer to
palpation than the fat pad on the left.

Ultrasound is performed, showing normal appearing breast tissue in
the subareolar and periareolar regions of the right breast.  There
is normal appearing fatty tissue in the right axilla.
IMPRESSION: No evidence of malignancy.  The prominence of the right axilla on
physical examination may be due to an asymmetrically prominent fat
pad or unencapsulated lipoma.

RECOMMENDATION:
Bilateral screening mammogram in 1 year.

I have discussed the findings and recommendations with the patient.
Results were also provided in writing at the conclusion of the
visit.  If applicable, a reminder letter will be sent to the
patient regarding the next appointment.

BI-RADS CATEGORY 1:  Negative.

## 2015-12-23 ENCOUNTER — Other Ambulatory Visit: Payer: Self-pay | Admitting: Obstetrics & Gynecology

## 2015-12-23 DIAGNOSIS — N6310 Unspecified lump in the right breast, unspecified quadrant: Secondary | ICD-10-CM

## 2015-12-29 ENCOUNTER — Other Ambulatory Visit: Payer: No Typology Code available for payment source

## 2016-01-06 ENCOUNTER — Other Ambulatory Visit: Payer: Self-pay | Admitting: Obstetrics & Gynecology

## 2016-01-06 ENCOUNTER — Ambulatory Visit
Admission: RE | Admit: 2016-01-06 | Discharge: 2016-01-06 | Disposition: A | Discharge status: Home / Self Care | Payer: No Typology Code available for payment source | Source: Ambulatory Visit | Attending: Obstetrics & Gynecology | Admitting: Obstetrics & Gynecology

## 2016-01-06 DIAGNOSIS — N6310 Unspecified lump in the right breast, unspecified quadrant: Secondary | ICD-10-CM

## 2017-01-31 ENCOUNTER — Emergency Department (HOSPITAL_COMMUNITY): Payer: Self-pay

## 2017-01-31 ENCOUNTER — Emergency Department (HOSPITAL_COMMUNITY)
Admission: EM | Admit: 2017-01-31 | Discharge: 2017-01-31 | Disposition: A | Discharge status: Home / Self Care | Payer: Self-pay | Attending: Emergency Medicine | Admitting: Emergency Medicine

## 2017-01-31 ENCOUNTER — Encounter (HOSPITAL_COMMUNITY): Payer: Self-pay | Admitting: Emergency Medicine

## 2017-01-31 DIAGNOSIS — R002 Palpitations: Secondary | ICD-10-CM | POA: Insufficient documentation

## 2017-01-31 DIAGNOSIS — I10 Essential (primary) hypertension: Secondary | ICD-10-CM | POA: Insufficient documentation

## 2017-01-31 DIAGNOSIS — Z79899 Other long term (current) drug therapy: Secondary | ICD-10-CM | POA: Insufficient documentation

## 2017-01-31 DIAGNOSIS — F17210 Nicotine dependence, cigarettes, uncomplicated: Secondary | ICD-10-CM | POA: Insufficient documentation

## 2017-01-31 LAB — CBC
HEMATOCRIT: 37.6 % (ref 36.0–46.0)
Hemoglobin: 12.5 g/dL (ref 12.0–15.0)
MCH: 28.2 pg (ref 26.0–34.0)
MCHC: 33.2 g/dL (ref 30.0–36.0)
MCV: 84.7 fL (ref 78.0–100.0)
PLATELETS: 236 10*3/uL (ref 150–400)
RBC: 4.44 MIL/uL (ref 3.87–5.11)
RDW: 14.1 % (ref 11.5–15.5)
WBC: 4.2 10*3/uL (ref 4.0–10.5)

## 2017-01-31 LAB — BASIC METABOLIC PANEL
Anion gap: 8 (ref 5–15)
BUN: 10 mg/dL (ref 6–20)
CO2: 22 mmol/L (ref 22–32)
Calcium: 8.7 mg/dL — ABNORMAL LOW (ref 8.9–10.3)
Chloride: 106 mmol/L (ref 101–111)
Creatinine, Ser: 0.62 mg/dL (ref 0.44–1.00)
GFR calc Af Amer: >60 mL/min (ref >60)
GFR calc non Af Amer: >60 mL/min (ref >60)
Glucose, Bld: 130 mg/dL — ABNORMAL HIGH (ref 65–99)
POTASSIUM: 3.8 mmol/L (ref 3.5–5.1)
Sodium: 136 mmol/L (ref 135–145)

## 2017-01-31 LAB — I-STAT TROPONIN, ED: TROPONIN I, POC: 0 ng/mL (ref 0.00–0.08)

## 2017-01-31 LAB — TSH: TSH: 2.091 u[IU]/mL (ref 0.350–4.500)

## 2017-01-31 NOTE — ED Provider Notes (Signed)
[facility] DEPT Provider Note   CSN: [id number] Arrival date & time: 01/31/17  1614    History   Chief Complaint Chief Complaint  Patient presents with  . Palpitations    HPI Ellen Ware is a 51 y.o. female.  51 year old female with a history of hypertension presents to the emergency department for evaluation of palpitations. Patient states that palpitations began shortly after waking this afternoon. Palpitations characterized as a rapid heartbeat. Patient states that symptoms lasted up until, and shortly after, ED arrival. She had a subsequent episode of palpitations while in the waiting room lasting a few minutes. Patient denies any known modifying factors of her symptoms. She has been compliant with her daily blood pressure medication. 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 No personal or Fhx of thyroid disease. No increased caffeine intake. Patient further denies associated fever, chest pain, shortness of breath, syncope or near-syncope, nausea, vomiting. No recent lower extremity swelling. No history of similar symptoms. She has a hx of tubal ligation and is currently going through menopause; denies possibility of pregnancy.   The history is provided by the patient. No language interpreter was used.  Palpitations      Past Medical History:  Diagnosis Date  . Hypertension   . Lipoma 08/11/2010  . VARICOSE VEINS, LOWER EXTREMITIES 08/13/2009   Qualifier: Diagnosis of  By: Nadara Eaton  MD, Mickel Baas      Patient Active Problem List   Diagnosis Date Noted  . Internal hordeolum of right eye 11/05/2012  . Superficial thrombophlebitis of right leg 05/25/2011  . Counseling on health promotion and disease prevention 08/11/2010  . Contact with or exposure to sexually transmitted disease 08/09/2010  . OBESITY, NOS 07/05/2006  . TOBACCO DEPENDENCE 07/05/2006  . HYPERTENSION, BENIGN SYSTEMIC 07/05/2006    Past Surgical History:  Procedure Laterality Date  .  CESAREAN SECTION     x 2  . COSMETIC SURGERY    . TUBAL LIGATION      OB History    No data available       Home Medications    Prior to Admission medications   Medication Sig Start Date End Date Taking? Authorizing Provider  B Complex-C (B-COMPLEX WITH VITAMIN C) tablet Take 1 tablet by mouth daily.   Yes [provider]  cholecalciferol (VITAMIN D) 1000 units tablet Take 1,000 Units by mouth daily.   Yes [provider]  GARLIC PO Take 1 tablet by mouth daily.   Yes [provider]  lisinopril-hydrochlorothiazide (PRINZIDE,ZESTORETIC) 20-12.5 MG per tablet TAKE ONE TABLET BY MOUTH ONCE DAILY 05/25/13  Yes Patrecia Pour, MD  Multiple Vitamin (MULTIVITAMIN) tablet Take 1 tablet by mouth daily.   Yes [provider]    Family History Family History  Problem Relation Age of Onset  . Hypertension Mother   . Heart disease Mother   . Diabetes Mother   . Arthritis Mother     Social History Social History  Substance Use Topics  . Smoking status: Current Every Day Smoker    Packs/day: 0.50    Types: Cigarettes  . Smokeless tobacco: Never Used  . Alcohol use Yes     Comment: 1-2 beers per week,      Allergies   Penicillins   Review of Systems Review of Systems  Cardiovascular: Positive for palpitations.  Ten systems reviewed and are negative for acute change, except as noted in the HPI.    Physical Exam Updated Vital Signs BP  114/63   Pulse 71   Temp 98.1 F (36.7 C) (Oral)   Resp 18   Ht 5\' 5"  (1.651 m)   Wt 93 kg (205 lb)   SpO2 100%   BMI 34.11 kg/m   Physical Exam  Constitutional: She is oriented to person, place, and time. She appears well-developed and well-nourished. No distress.  Nontoxic and in NAD  HENT:  Head: Normocephalic and atraumatic.  Eyes: Conjunctivae and EOM are normal. No scleral icterus.  Neck: Normal range of motion.  Cardiovascular: Normal rate, regular rhythm and intact distal pulses.     Pulmonary/Chest: Effort normal. No respiratory distress. She has no wheezes. She has no rales.  Lungs CTAB. Respirations even and unlabored.  Abdominal: Soft. She exhibits no distension. There is no tenderness. There is no guarding.  Soft, obese, nondistended; nontender   Musculoskeletal: Normal range of motion.  No lower extremity pitting edema bilaterally.  Neurological: She is alert and oriented to person, place, and time. She exhibits normal muscle tone. Coordination normal.  Skin: Skin is warm and dry. No rash noted. She is not diaphoretic. No erythema. No pallor.  Psychiatric: She has a normal mood and affect. Her behavior is normal.  Nursing note and vitals reviewed.    ED Treatments / Results  Labs (all labs ordered are listed, but only abnormal results are displayed) Labs Reviewed  BASIC METABOLIC PANEL - Abnormal; Notable for the following:       Result Value   Glucose, Bld 130 (*)    Calcium 8.7 (*)    All other components within normal limits  CBC  TSH  I-STAT TROPONIN, ED    EKG  EKG Interpretation None       ED ECG REPORT   Date: 01/31/2017  Rate: 88  Rhythm: normal sinus rhythm  QRS Axis: normal  Intervals: normal  ST/T Wave abnormalities: normal  Conduction Disutrbances:none  Narrative Interpretation: NSR with possible LAE; no STEMI  Old EKG Reviewed: none available  I have personally reviewed the EKG tracing and agree with the computerized printout as noted.   Radiology Dg Chest 2 View  Result Date: 01/31/2017 CLINICAL DATA:  51 year old female with history of palpitations. EXAM: CHEST  2 VIEW COMPARISON:  Chest x-ray 05/12/2010. FINDINGS: Lung volumes are normal. No consolidative airspace disease. No pleural effusions. No pneumothorax. No pulmonary nodule or mass noted. Pulmonary vasculature and the cardiomediastinal silhouette are within normal limits. IMPRESSION: No radiographic evidence of acute cardiopulmonary disease. Electronically Signed    By: Vinnie Langton M.D.   On: 01/31/2017 17:34    Procedures Procedures (including critical care time)  Medications Ordered in ED Medications - No data to display   Initial Impression / Assessment and Plan / ED Course  I have reviewed the triage vital signs and the nursing notes.  Pertinent labs & imaging results that were available during my care of the patient were reviewed by me and considered in my medical decision making (see chart for details).     51 year old female presents to the emergency department for complaints of palpitations. She reports that palpitations were characterized as a rapid heartbeat. She experienced no chest pain, shortness of breath, nausea, lightheadedness, or syncope. Symptoms have spontaneously subsided. Patient with reassuring vital signs. She has never been tachycardic since arrival to the ED. Blood pressure well controlled with home lisinopril. Workup in the emergency department today is reassuring. Troponin is within normal limits. No electrolyte derangements. TSH also normal. Chest x-ray without evidence  of acute cardiopulmonary abnormality. EKG consistent with normal sinus rhythm.  Plan to continue with outpatient cardiology follow-up. The patient may benefit from Holter monitoring. She has been given a referral to [facility] cardiology. Patient advised to limit her caffeine intake and to avoid use of any over-the-counter dietary supplements. Return precautions discussed and provided. Patient discharged in stable condition with no unaddressed concerns.   Final Clinical Impressions(s) / ED Diagnoses   Final diagnoses:  Palpitations    New Prescriptions Discharge Medication List as of 01/31/2017 10:45 PM       Antonietta Breach, PA-C 01/31/17 2307    Gareth Morgan, MD 02/01/17 [phone]

## 2017-01-31 NOTE — Discharge Instructions (Signed)
Your cardiac workup was reassuring today. We recommend follow-up with a cardiologist, especially if you continue to experience palpitations. You may benefit from Holter monitoring. Return to the emergency department for new or concerning symptoms. Avoid caffeine or any dietary supplements. Try to get plenty of rest/sleep per day.

## 2017-01-31 NOTE — ED Triage Notes (Signed)
Pt c/o palpitations when waking up this afternoon--- pt works nights, no hx of chest pain, no pain at present.

## 2017-01-31 NOTE — ED Notes (Signed)
Pt departed in NAD, refused use of wheelchair.  

## 2019-06-06 ENCOUNTER — Ambulatory Visit: Payer: PRIVATE HEALTH INSURANCE | Attending: Internal Medicine

## 2019-06-06 DIAGNOSIS — Z20822 Contact with and (suspected) exposure to covid-19: Secondary | ICD-10-CM | POA: Insufficient documentation

## 2019-06-07 LAB — NOVEL CORONAVIRUS, NAA: SARS-CoV-2, NAA: NOT DETECTED

## 2019-07-24 ENCOUNTER — Ambulatory Visit: Payer: Self-pay | Attending: Internal Medicine

## 2019-07-24 DIAGNOSIS — Z23 Encounter for immunization: Secondary | ICD-10-CM

## 2019-07-24 NOTE — Progress Notes (Signed)
   Covid-19 Vaccination Clinic  Name:  Ellen Ware    MRN: [id number] DOB: 25-Aug-1965  07/24/2019  [name] was observed post Covid-19 immunization for 15 minutes without incident. She was provided with Vaccine Information Sheet and instruction to access the V-Safe system.   [name] was instructed to call 911 with any severe reactions post vaccine: Marland Kitchen Difficulty breathing  . Swelling of face and throat  . A fast heartbeat  . A bad rash all over body  . Dizziness and weakness   Immunizations Administered    Name Date Dose VIS Date Route   Pfizer COVID-19 Vaccine 07/24/2019  8:49 AM 0.3 mL 04/18/2019 Intramuscular   Manufacturer: [place]   Lot: [id number]   [place]: [id number]

## 2019-08-18 ENCOUNTER — Ambulatory Visit: Payer: Self-pay | Attending: Internal Medicine

## 2019-08-18 DIAGNOSIS — Z23 Encounter for immunization: Secondary | ICD-10-CM

## 2019-08-18 NOTE — Progress Notes (Signed)
   Covid-19 Vaccination Clinic  Name:  Ellen Ware    MRN: [id number] DOB: October 27, 1965  08/18/2019  Ellen Ware was observed post Covid-19 immunization for 15 minutes without incident. She was provided with Vaccine Information Sheet and instruction to access the V-Safe system.   Ellen Ware was instructed to call 911 with any severe reactions post vaccine: Marland Kitchen Difficulty breathing  . Swelling of face and throat  . A fast heartbeat  . A bad rash all over body  . Dizziness and weakness   Immunizations Administered    Name Date Dose VIS Date Route   Pfizer COVID-19 Vaccine 08/18/2019  8:24 AM 0.3 mL 04/18/2019 Intramuscular   Manufacturer: [place]   Lot: [id number]   [place]: [id number]

## 2019-09-17 ENCOUNTER — Ambulatory Visit (HOSPITAL_COMMUNITY)
Admission: EM | Admit: 2019-09-17 | Discharge: 2019-09-17 | Disposition: A | Discharge status: Home / Self Care | Payer: PRIVATE HEALTH INSURANCE | Attending: Family Medicine | Admitting: Family Medicine

## 2019-09-17 ENCOUNTER — Encounter (HOSPITAL_COMMUNITY): Payer: Self-pay

## 2019-09-17 ENCOUNTER — Other Ambulatory Visit: Payer: Self-pay

## 2019-09-17 DIAGNOSIS — I10 Essential (primary) hypertension: Secondary | ICD-10-CM

## 2019-09-17 DIAGNOSIS — R21 Rash and other nonspecific skin eruption: Secondary | ICD-10-CM

## 2019-09-17 MED ORDER — PREDNISONE 10 MG (21) PO TBPK: ORAL_TABLET | Freq: Every day | ORAL | 0 refills | Status: DC

## 2019-09-17 NOTE — ED Triage Notes (Signed)
Pt states she has a rash on her chest and back. X 3 days. Pt states she itches like crazy.

## 2019-09-17 NOTE — ED Provider Notes (Addendum)
[place]   [id number] 09/17/19 Arrival Time: 57  ASSESSMENT & PLAN:  1. Rash and nonspecific skin eruption   2. Elevated blood pressure reading in office with diagnosis of hypertension     Begin trial of: Meds ordered this encounter  Medications  . predniSONE (STERAPRED UNI-PAK 21 TAB) 10 MG (21) TBPK tablet    Sig: Take by mouth daily. Take as directed.    Dispense:  21 tablet    Refill:  0   Benadryl if needed.   Discharge Instructions     Your blood pressure was noted to be elevated during your visit today. If you are currently taking medication for high blood pressure, please ensure you are taking this as directed. If you do not have a history of high blood pressure and your blood pressure remains persistently elevated, you may need to begin taking a medication at some point. You may return here within the next few days to recheck if unable to see your primary care provider or if do not have a one.  BP (!) 151/70 (BP Location: Right Arm)   Pulse 80   Temp 98.6 F (37 C)   Resp 18   Wt 103.4 kg   SpO2 100%   BMI 37.94 kg/m       Reviewed expectations re: course of current medical issues. Questions answered. Outlined signs and symptoms indicating need for more acute intervention. Patient verbalized understanding. After Visit Summary given.   SUBJECTIVE:  Ellen Ware is a 54 y.o. female who presents with a skin complaint.   Location: back/torso Onset: gradual Duration: first noted approx 3 d ago Associated pruritis? significant Associated pain? none Progression: increasing steadily  Drainage? none Known trigger? No  New soaps/lotions/topicals/detergents? No  Environmental exposures? No  Contacts with similar? No  Recent travel? No  Other associated symptoms: none Therapies tried thus far: none Arthralgia or myalgia? none Recent illness? none Fever? none New medications? none No specific aggravating or alleviating factors  reported.  Increased blood pressure noted today. Reports that she has been treated for hypertension in the past.  She reports no chest pain on exertion, no dyspnea on exertion, no swelling of ankles, no orthostatic dizziness or lightheadedness, no orthopnea or paroxysmal nocturnal dyspnea and no palpitations.   OBJECTIVE: Vitals:   09/17/19 1656 09/17/19 1659  BP:  (!) 151/70  Pulse:  80  Resp:  18  Temp:  98.6 F (37 C)  SpO2:  100%  Weight: 103.4 kg     General appearance: alert; no distress HEENT: ; AT Neck: supple with FROM Lungs: speaks full sentences without difficulty; unlabored Heart: regular Extremities: no edema; moves all extremities normally Skin: warm and dry; signs of infection: no; scattered papular rash over back/torso; few areas on neck; few excoriations Psychological: alert and cooperative; normal mood and affect  Allergies  Allergen Reactions  . Penicillins Swelling    Reaction in childhood Has patient had a PCN reaction causing immediate rash, facial/tongue/throat swelling, SOB or lightheadedness with hypotension: No Has patient had a PCN reaction causing severe rash involving mucus membranes or skin necrosis: No Has patient had a PCN reaction that required hospitalization: No Has patient had a PCN reaction occurring within the last 10 years: No If all of the above answers are "NO", then may proceed with Cephalosporin use.    Past Medical History:  Diagnosis Date  . Hypertension   . Lipoma 08/11/2010  . 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 Marital status: Divorced    Spouse name: Not on file  . Number of children: Not on file  . Years of education: Not on file  . Highest education level: Not on file  Occupational History  . Not on file  Tobacco Use  . Smoking status: Current Every Day Smoker    Packs/day: 0.50    Types: Cigarettes  .  Smokeless tobacco: Never Used  Substance and Sexual Activity  . Alcohol use: Yes    Comment: 1-2 beers per week,   . Drug use: Yes    Types: Marijuana  . Sexual activity: Yes    Comment: tubal  Other Topics Concern  . 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   Social Determinants of Health   Financial Resource Strain:   . Difficulty of Paying Living Expenses:   Food Insecurity:   . Worried About Charity fundraiser in the Last Year:   . Arboriculturist in the Last Year:   Transportation Needs:   . Film/video editor (Medical):   Marland Kitchen Lack of Transportation (Non-Medical):   Physical Activity:   . Days of Exercise per Week:   . Minutes of Exercise per Session:   Stress:   . Feeling of Stress :   Social Connections:   . Frequency of Communication with Friends and Family:   . Frequency of Social Gatherings with Friends and Family:   . Attends Religious Services:   . Active Member of Clubs or Organizations:   . Attends Archivist Meetings:   Marland Kitchen Marital Status:   Intimate Partner Violence:   . Fear of Current or Ex-Partner:   . Emotionally Abused:   Marland Kitchen Physically Abused:   . Sexually Abused:    Family History  Problem Relation Age of Onset  . Hypertension Mother   . Heart disease Mother   . Diabetes Mother   . Arthritis Mother    Past Surgical History:  Procedure Laterality Date  . CESAREAN SECTION     x 2  . COSMETIC SURGERY    . TUBAL LIGATION       Vanessa Kick, MD 09/17/19 [id number]    Vanessa Kick, MD 09/17/19 [id number]

## 2019-09-17 NOTE — Discharge Instructions (Signed)
Your blood pressure was noted to be elevated during your visit today. If you are currently taking medication for high blood pressure, please ensure you are taking this as directed. If you do not have a history of high blood pressure and your blood pressure remains persistently elevated, you may need to begin taking a medication at some point. You may return here within the next few days to recheck if unable to see your primary care provider or if do not have a one.  BP (!) 151/70 (BP Location: Right Arm)   Pulse 80   Temp 98.6 F (37 C)   Resp 18   Wt 103.4 kg   SpO2 100%   BMI 37.94 kg/m

## 2020-01-06 ENCOUNTER — Other Ambulatory Visit: Payer: Self-pay | Admitting: Physician Assistant

## 2021-02-01 ENCOUNTER — Encounter: Payer: Self-pay | Admitting: Family Medicine

## 2021-02-01 ENCOUNTER — Ambulatory Visit: Payer: PRIVATE HEALTH INSURANCE | Admitting: Family Medicine

## 2021-02-01 VITALS — BP 118/76 | HR 93 | Ht 65.0 in | Wt 217.0 lb

## 2021-02-01 DIAGNOSIS — Z789 Other specified health status: Secondary | ICD-10-CM

## 2021-02-01 NOTE — Progress Notes (Signed)
Subjective:     Patient ID: Ellen Ware, female   DOB: 10/31/1965, 55 y.o.   MRN: [id number]  HPI Yachet presents to the employee health and wellness clinic today for her required wellness visit for insurance. Her PCP is Herbalist through [facility]. She is UTD on her mammogram, pap smear, and colonoscopy. She reports she could do better with eating healthy and exercising. She has a lot going on with taking care of her siblings and children. She denies any problems or concerns today.   Past Medical History:  Diagnosis Date   Hypertension    Lipoma 08/11/2010   VARICOSE VEINS, LOWER EXTREMITIES 08/13/2009   Qualifier: Diagnosis of  By: Nadara Eaton  MD, Mickel Baas     Allergies  Allergen Reactions   Penicillins Swelling    Reaction in childhood Has patient had a PCN reaction causing immediate rash, facial/tongue/throat swelling, SOB or lightheadedness with hypotension: No Has patient had a PCN reaction causing severe rash involving mucus membranes or skin necrosis: No Has patient had a PCN reaction that required hospitalization: No Has patient had a PCN reaction occurring within the last 10 years: No If all of the above answers are "NO", then may proceed with Cephalosporin use.    Current Outpatient Medications:    amLODipine (NORVASC) 10 MG tablet, Take 10 mg by mouth daily., Disp: , Rfl:    atorvastatin (LIPITOR) 10 MG tablet, Take 10 mg by mouth daily., Disp: , Rfl:    FLUoxetine (PROZAC) 10 MG capsule, Take 10 mg by mouth daily., Disp: , Rfl:    folic acid (FOLVITE) 1 MG tablet, Take 1 mg by mouth daily., Disp: , Rfl:    metFORMIN (GLUCOPHAGE) 500 MG tablet, Take 500 mg by mouth every morning., Disp: , Rfl:    methotrexate 2.5 MG tablet, SMARTSIG:3 Tablet(s) By Mouth Once a Week, Disp: , Rfl:    OZEMPIC, 0.25 OR 0.5 MG/DOSE, 2 MG/1.5ML SOPN, SMARTSIG:0.2 Milliliter(s) SUB-Q Once a Week, Disp: , Rfl:    pantoprazole (PROTONIX) 40 MG tablet, Take 40 mg by mouth daily., Disp: ,  Rfl:    tiZANidine (ZANAFLEX) 2 MG tablet, SMARTSIG:0.25-1 Tablet(s) By Mouth 1-4 Times Daily, Disp: , Rfl:      Review of Systems  Constitutional:  Negative for chills, fatigue, fever and unexpected weight change.  HENT:  Negative for congestion, ear pain, sinus pressure, sinus pain and sore throat.   Eyes:  Negative for discharge and visual disturbance.  Respiratory:  Negative for cough, shortness of breath and wheezing.   Cardiovascular:  Negative for chest pain and leg swelling.  Gastrointestinal:  Negative for abdominal pain, blood in stool, constipation, diarrhea, nausea and vomiting.  Genitourinary:  Negative for difficulty urinating and hematuria.  Skin:  Negative for color change.  Neurological:  Negative for dizziness, weakness, light-headedness and headaches.  Hematological:  Negative for adenopathy.  All other systems reviewed and are negative.     Objective:   Physical Exam Vitals reviewed.  Constitutional:      General: She is not in acute distress.    Appearance: Normal appearance. She is well-developed.  HENT:     Head: Normocephalic and atraumatic.  Eyes:     General:        Right eye: No discharge.        Left eye: No discharge.  Cardiovascular:     Rate and Rhythm: Normal rate and regular rhythm.     Heart sounds: Normal heart sounds.  Pulmonary:  Effort: Pulmonary effort is normal. No respiratory distress.     Breath sounds: Normal breath sounds.  Musculoskeletal:     Cervical back: Neck supple.  Skin:    General: Skin is warm and dry.  Neurological:     Mental Status: She is alert and oriented to person, place, and time.  Psychiatric:        Mood and Affect: Mood normal.        Behavior: Behavior normal.   Today's Vitals   02/01/21 1436  BP: 118/76  Pulse: 93  SpO2: 99%  Weight: 217 lb (98.4 kg)  Height: 5\' 5"  (1.651 m)   Body mass index is 36.11 kg/m.     Assessment:     Participant in health and wellness plan     Plan:      Keep all regular appts with PCP. Encouraged efforts at healthy eating and exercise. F/u here prn.

## 2022-01-31 ENCOUNTER — Encounter: Payer: Self-pay | Admitting: Nurse Practitioner

## 2022-01-31 ENCOUNTER — Ambulatory Visit: Payer: PRIVATE HEALTH INSURANCE | Admitting: Nurse Practitioner

## 2022-01-31 VITALS — BP 110/60 | HR 98

## 2022-01-31 DIAGNOSIS — Z789 Other specified health status: Secondary | ICD-10-CM

## 2022-01-31 NOTE — Progress Notes (Signed)
Subjective:  Patient ID: Ellen Ware, female    DOB: 10/21/1965  Age: 56 y.o. MRN: [id number]  CC: Wellness Exam  HPI OTTO CARAWAY presents for wellness exam visit for insurance benefit.  Patient has a PCP: Dr. Evette Doffing with [facility] PM[facility] significant for: HTN, T2DM Last labs per PCP were completed: March 2023  Health Maintenance:  Colonoscopy: 2019, repeat in 2029 Mammogram: March 2023 Pap: up to date   Smoker: Former  Immunizations:  Biomedical scientist-  has not done yet, still thinking about it. Marland Kitchen  COVID- x4   Lifestyle: Diet- no specific diet but tries to eat decent Exercise- no exercise but very active at work     Past Medical History:  Diagnosis Date   Hypertension    Lipoma 08/11/2010   VARICOSE VEINS, LOWER EXTREMITIES 08/13/2009   Qualifier: Diagnosis of  By: Nadara Eaton  MD, Mickel Baas      Past Surgical History:  Procedure Laterality Date   CESAREAN SECTION     x 2   COSMETIC SURGERY     TUBAL LIGATION      Outpatient Medications Prior to Visit  Medication Sig Dispense Refill   amLODipine (NORVASC) 10 MG tablet Take 10 mg by mouth daily.     atorvastatin (LIPITOR) 10 MG tablet Take 10 mg by mouth daily.     FLUoxetine (PROZAC) 10 MG capsule Take 10 mg by mouth daily.     folic acid (FOLVITE) 1 MG tablet Take 1 mg by mouth daily.     metFORMIN (GLUCOPHAGE) 500 MG tablet Take 500 mg by mouth every morning.     methotrexate 2.5 MG tablet SMARTSIG:3 Tablet(s) By Mouth Once a Week     OZEMPIC, 0.25 OR 0.5 MG/DOSE, 2 MG/1.5ML SOPN SMARTSIG:0.2 Milliliter(s) SUB-Q Once a Week     pantoprazole (PROTONIX) 40 MG tablet Take 40 mg by mouth daily.     tiZANidine (ZANAFLEX) 2 MG tablet SMARTSIG:0.25-1 Tablet(s) By Mouth 1-4 Times Daily (Patient not taking: Reported on 01/31/2022)     No facility-administered medications prior to visit.    ROS Review of Systems  Respiratory:  Negative for shortness of breath.   Cardiovascular:  Negative for chest pain and leg swelling.   Neurological:  Negative for headaches.    Objective:  BP 110/60   Pulse 98   SpO2 99%   Physical Exam Constitutional:      General: She is not in acute distress. HENT:     Head: Normocephalic.  Cardiovascular:     Rate and Rhythm: Normal rate and regular rhythm.     Pulses: Normal pulses.     Heart sounds: Normal heart sounds.  Pulmonary:     Effort: Pulmonary effort is normal.     Breath sounds: Normal breath sounds.  Musculoskeletal:        General: Normal range of motion.     Right lower leg: No edema.     Left lower leg: No edema.  Skin:    General: Skin is warm.  Neurological:     General: No focal deficit present.     Mental Status: She is alert and oriented to person, place, and time.  Psychiatric:        Mood and Affect: Mood normal.        Behavior: Behavior normal.      Assessment & Plan:    Takera was seen today for wellness exam.  Diagnoses and all orders for this visit:  Participant in health and wellness  plan Adult wellness physical was conducted today. Importance of diet and exercise were discussed in detail.  We reviewed immunizations and gave recommendations regarding current immunization needed for age. Recommended Shingrix vaccine. Patient to let me know when she would like to complete this.   Preventative health exams needed: up to date.  Patient was advised yearly wellness exam and follow-up with PCP as recommended.     No orders of the defined types were placed in this encounter.   No orders of the defined types were placed in this encounter.   Follow-up: as needed.

## 2023-05-08 ENCOUNTER — Other Ambulatory Visit: Payer: Self-pay

## 2023-05-08 ENCOUNTER — Emergency Department (HOSPITAL_BASED_OUTPATIENT_CLINIC_OR_DEPARTMENT_OTHER): Payer: PRIVATE HEALTH INSURANCE

## 2023-05-08 ENCOUNTER — Encounter (HOSPITAL_BASED_OUTPATIENT_CLINIC_OR_DEPARTMENT_OTHER): Payer: Self-pay

## 2023-05-08 ENCOUNTER — Emergency Department (HOSPITAL_BASED_OUTPATIENT_CLINIC_OR_DEPARTMENT_OTHER)
Admission: EM | Admit: 2023-05-08 | Discharge: 2023-05-08 | Disposition: A | Discharge status: Home / Self Care | Payer: PRIVATE HEALTH INSURANCE | Attending: Emergency Medicine | Admitting: Emergency Medicine

## 2023-05-08 DIAGNOSIS — Z794 Long term (current) use of insulin: Secondary | ICD-10-CM | POA: Insufficient documentation

## 2023-05-08 DIAGNOSIS — M79605 Pain in left leg: Secondary | ICD-10-CM | POA: Insufficient documentation

## 2023-05-08 NOTE — ED Triage Notes (Signed)
 Pt arrives with c/o left leg pain that started a 2 weeks ago. Pt reports pain in her left calf up into her knee. Pt denies CP or SOB.

## 2023-05-08 NOTE — Discharge Instructions (Signed)
Follow up with your Physician for recheck.  Return if any problems. Tylenol for discomfort

## 2023-05-09 NOTE — ED Provider Notes (Signed)
 [place] [facility] Provider Note   CSN: [id number] Arrival date & time: 05/08/23  1649     History  Chief Complaint  Patient presents with   Leg Pain    Ellen Ware is a 58 y.o. female.  Patient complains of pain in her left leg.  Patient reports that symptoms began 2 weeks ago.  Patient complains of discomfort in her calf and behind her left knee.  Patient denies any fever or chills.  She reports feeling a knot in her leg.  The history is provided by the patient. No language interpreter was used.  Leg Pain Location:  Leg Injury: no   Leg location:  L lower leg Pain details:    Quality:  Aching   Radiates to:  Does not radiate   Timing:  Constant   Progression:  Worsening Chronicity:  New Relieved by:  Nothing Worsened by:  Nothing Associated symptoms: no decreased ROM        Home Medications Prior to Admission medications   Medication Sig Start Date End Date Taking? Authorizing Provider  amLODipine (NORVASC) 10 MG tablet Take 10 mg by mouth daily. 01/30/21   [provider]  atorvastatin (LIPITOR) 10 MG tablet Take 10 mg by mouth daily. 01/30/21   [provider]  FLUoxetine (PROZAC) 10 MG capsule Take 10 mg by mouth daily. 01/24/21   [provider]  folic acid (FOLVITE) 1 MG tablet Take 1 mg by mouth daily. 01/19/21   [provider]  metFORMIN (GLUCOPHAGE) 500 MG tablet Take 500 mg by mouth every morning. 01/24/21   [provider]  methotrexate 2.5 MG tablet SMARTSIG:3 Tablet(s) By Mouth Once a Week 01/19/21   [provider]  OZEMPIC, 0.25 OR 0.5 MG/DOSE, 2 MG/1.5ML SOPN SMARTSIG:0.2 Milliliter(s) SUB-Q Once a Week 01/26/21   [provider]  pantoprazole (PROTONIX) 40 MG tablet Take 40 mg by mouth daily. 01/24/21   [provider]      Allergies    Penicillins    Review of Systems   Review of Systems  All other systems reviewed and are  negative.   Physical Exam Updated Vital Signs BP 134/72 (BP Location: Right Arm)   Pulse 87   Temp 98.4 F (36.9 C)   Resp 18   Ht 5' 4 (1.626 m)   Wt 98 kg   SpO2 98%   BMI 37.08 kg/m  Physical Exam Vitals reviewed.  Constitutional:      Appearance: Normal appearance.  Cardiovascular:     Rate and Rhythm: Normal rate.  Pulmonary:     Effort: Pulmonary effort is normal.  Musculoskeletal:        General: Swelling and tenderness present.     Comments: Tender posterior left calf, full range of motion neurovascular and neurosensory intact  Skin:    General: Skin is warm.  Neurological:     General: No focal deficit present.     Mental Status: She is alert.     ED Results / Procedures / Treatments   Labs (all labs ordered are listed, but only abnormal results are displayed) Labs Reviewed - No data to display  EKG None  Radiology US  Venous Img Lower Unilateral Left Result Date: 05/08/2023 CLINICAL DATA:  Left lower extremity popliteal pain. Evaluate for DVT. EXAM: LEFT LOWER EXTREMITY VENOUS DOPPLER ULTRASOUND TECHNIQUE: Gray-scale sonography with graded compression, as well as color Doppler and duplex ultrasound were performed to evaluate the lower extremity deep  venous systems from the level of the common femoral vein and including the common femoral, femoral, profunda femoral, popliteal and calf veins including the posterior tibial, peroneal and gastrocnemius veins when visible. The superficial great saphenous vein was also interrogated. Spectral Doppler was utilized to evaluate flow at rest and with distal augmentation maneuvers in the common femoral, femoral and popliteal veins. COMPARISON:  None Available. FINDINGS: Contralateral Common Femoral Vein: Respiratory phasicity is normal and symmetric with the symptomatic side. No evidence of thrombus. Normal compressibility. Common Femoral Vein: No evidence of thrombus. Normal compressibility, respiratory phasicity and  response to augmentation. Saphenofemoral Junction: No evidence of thrombus. Normal compressibility and flow on color Doppler imaging. Profunda Femoral Vein: No evidence of thrombus. Normal compressibility and flow on color Doppler imaging. Femoral Vein: No evidence of thrombus. Normal compressibility, respiratory phasicity and response to augmentation. Popliteal Vein: No evidence of thrombus. Normal compressibility, respiratory phasicity and response to augmentation. Calf Veins: No evidence of thrombus. Normal compressibility and flow on color Doppler imaging. Superficial Great Saphenous Vein: No evidence of thrombus. Normal compressibility. Other Findings:  No evidence of a Baker's cyst (image 19). IMPRESSION: 1. No evidence of DVT within the left lower extremity. 2. No evidence of a Baker's cyst. Electronically Signed   By: Norleen Roulette M.D.   On: 05/08/2023 18:32    Procedures Procedures    Medications Ordered in ED Medications - No data to display  ED Course/ Medical Decision Making/ A&P                                 Medical Decision Making Patient complains of pain in her left leg  Amount and/or Complexity of Data Reviewed Radiology: ordered and independent interpretation performed. Decision-making details documented in ED Course.    Details: Ultrasound left leg shows no evidence of DVT or Baker's cyst  Risk Risk Details: Patient counseled on leg pain.  She is advised Tylenol for discomfort follow-up with her physician for recheck.           Final Clinical Impression(s) / ED Diagnoses Final diagnoses:  Left leg pain    Rx / DC Orders ED Discharge Orders     None      An After Visit Summary was printed and given to the patient.    Flint Sonny MARLA DEVONNA 05/09/23 2042    Cottie Donnice PARAS, MD 05/09/23 2255
